# Patient Record
Sex: Female | Born: 2000 | Race: Black or African American | Hispanic: No | Marital: Single | Smoking: Never smoker
Health system: Southern US, Community
[De-identification: ages and names within clinical notes are randomized; demographics above are authoritative.]

## PROBLEM LIST (undated history)

## (undated) ENCOUNTER — Ambulatory Visit

## (undated) ENCOUNTER — Inpatient Hospital Stay (HOSPITAL_COMMUNITY): Payer: Self-pay

## (undated) DIAGNOSIS — F32A Depression, unspecified: Secondary | ICD-10-CM

## (undated) DIAGNOSIS — Z973 Presence of spectacles and contact lenses: Secondary | ICD-10-CM

## (undated) DIAGNOSIS — F419 Anxiety disorder, unspecified: Secondary | ICD-10-CM

## (undated) DIAGNOSIS — F329 Major depressive disorder, single episode, unspecified: Secondary | ICD-10-CM

## (undated) DIAGNOSIS — D571 Sickle-cell disease without crisis: Secondary | ICD-10-CM

## (undated) HISTORY — DX: Anxiety disorder, unspecified: F41.9

## (undated) HISTORY — DX: Sickle-cell disease without crisis: D57.1

## (undated) HISTORY — DX: Depression, unspecified: F32.A

## (undated) HISTORY — DX: Major depressive disorder, single episode, unspecified: F32.9

## (undated) HISTORY — PX: TONSILLECTOMY: SUR1361

---
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
## 2019-01-22 ENCOUNTER — Encounter (HOSPITAL_COMMUNITY): Payer: Self-pay

## 2019-01-22 ENCOUNTER — Other Ambulatory Visit: Payer: Self-pay

## 2019-01-22 ENCOUNTER — Emergency Department (HOSPITAL_COMMUNITY)
Admission: EM | Admit: 2019-01-22 | Discharge: 2019-01-22 | Disposition: A | Discharge status: Home / Self Care | Payer: No Typology Code available for payment source | Attending: Emergency Medicine | Admitting: Emergency Medicine

## 2019-01-22 ENCOUNTER — Emergency Department (HOSPITAL_COMMUNITY): Payer: No Typology Code available for payment source

## 2019-01-22 DIAGNOSIS — Y92410 Unspecified street and highway as the place of occurrence of the external cause: Secondary | ICD-10-CM | POA: Insufficient documentation

## 2019-01-22 DIAGNOSIS — Z975 Presence of (intrauterine) contraceptive device: Secondary | ICD-10-CM | POA: Diagnosis not present

## 2019-01-22 DIAGNOSIS — Y93I9 Activity, other involving external motion: Secondary | ICD-10-CM | POA: Diagnosis not present

## 2019-01-22 DIAGNOSIS — Y999 Unspecified external cause status: Secondary | ICD-10-CM | POA: Diagnosis not present

## 2019-01-22 DIAGNOSIS — S29011A Strain of muscle and tendon of front wall of thorax, initial encounter: Secondary | ICD-10-CM | POA: Insufficient documentation

## 2019-01-22 DIAGNOSIS — S299XXA Unspecified injury of thorax, initial encounter: Secondary | ICD-10-CM | POA: Diagnosis present

## 2019-01-22 MED ORDER — IBUPROFEN 600 MG PO TABS: 600.0000 mg | ORAL_TABLET | Freq: Four times a day (QID) | ORAL | 0 refills | Status: DC | PRN

## 2019-01-22 MED ORDER — IBUPROFEN 200 MG PO TABS
600.0000 mg | ORAL_TABLET | Freq: Once | ORAL | Status: AC
  Administered 2019-01-22: 600 mg via ORAL
  Filled 2019-01-22: qty 3
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
## 2019-01-22 NOTE — ED Notes (Signed)
Officer at bedside.

## 2019-01-22 NOTE — ED Notes (Signed)
Pt verbalizes understanding of DC instructions. Pt belongings returned and is ambulatory out of ED. Pt used desk phone to call transportation

## 2019-01-22 NOTE — ED Notes (Signed)
Pt using desk phone to call mother and update on condition
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
## 2019-04-11 ENCOUNTER — Ambulatory Visit (HOSPITAL_COMMUNITY)
Admission: EM | Admit: 2019-04-11 | Discharge: 2019-04-11 | Disposition: A | Discharge status: Home / Self Care | Payer: No Typology Code available for payment source | Attending: Family Medicine | Admitting: Family Medicine

## 2019-04-11 ENCOUNTER — Encounter (HOSPITAL_COMMUNITY): Payer: Self-pay | Admitting: Emergency Medicine

## 2019-04-11 ENCOUNTER — Other Ambulatory Visit: Payer: Self-pay

## 2019-04-11 ENCOUNTER — Ambulatory Visit (INDEPENDENT_AMBULATORY_CARE_PROVIDER_SITE_OTHER): Payer: No Typology Code available for payment source

## 2019-04-11 DIAGNOSIS — M25561 Pain in right knee: Secondary | ICD-10-CM

## 2019-04-11 DIAGNOSIS — S01412A Laceration without foreign body of left cheek and temporomandibular area, initial encounter: Secondary | ICD-10-CM | POA: Diagnosis not present

## 2019-04-11 MED ORDER — IBUPROFEN 800 MG PO TABS: 800.0000 mg | ORAL_TABLET | Freq: Three times a day (TID) | ORAL | 0 refills | Status: DC

## 2019-04-11 MED ORDER — IBUPROFEN 800 MG PO TABS
800.0000 mg | ORAL_TABLET | Freq: Once | ORAL | Status: AC
  Administered 2019-04-11: 800 mg via ORAL

## 2019-04-11 MED ORDER — IBUPROFEN 800 MG PO TABS
ORAL_TABLET | ORAL | Status: AC
  Filled 2019-04-11: qty 1
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
## 2019-04-11 NOTE — Discharge Instructions (Signed)
Use ice to knee.  Take ibuprofen 3 times a day with food.  Limit walking while knee is painful. Wash face of wound twice a day.  Apply antibiotic ointment, Aquaphor, or Vaseline several times a day to keep it moist Return as needed May need orthopedic if knee pain persists
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
## 2019-04-16 ENCOUNTER — Emergency Department (HOSPITAL_COMMUNITY)
Admission: EM | Admit: 2019-04-16 | Discharge: 2019-04-16 | Disposition: A | Discharge status: Home / Self Care | Payer: No Typology Code available for payment source | Attending: Emergency Medicine | Admitting: Emergency Medicine

## 2019-04-16 ENCOUNTER — Other Ambulatory Visit: Payer: Self-pay

## 2019-04-16 ENCOUNTER — Encounter (HOSPITAL_COMMUNITY): Payer: Self-pay | Admitting: Emergency Medicine

## 2019-04-16 DIAGNOSIS — T424X2A Poisoning by benzodiazepines, intentional self-harm, initial encounter: Secondary | ICD-10-CM | POA: Insufficient documentation

## 2019-04-16 DIAGNOSIS — R45851 Suicidal ideations: Secondary | ICD-10-CM | POA: Insufficient documentation

## 2019-04-16 DIAGNOSIS — F1210 Cannabis abuse, uncomplicated: Secondary | ICD-10-CM | POA: Insufficient documentation

## 2019-04-16 DIAGNOSIS — Z20822 Contact with and (suspected) exposure to covid-19: Secondary | ICD-10-CM | POA: Diagnosis not present

## 2019-04-16 DIAGNOSIS — T50902A Poisoning by unspecified drugs, medicaments and biological substances, intentional self-harm, initial encounter: Secondary | ICD-10-CM

## 2019-04-16 LAB — CBC
HCT: 43.3 % (ref 36.0–46.0)
Hemoglobin: 14.7 g/dL (ref 12.0–15.0)
MCH: 30.6 pg (ref 26.0–34.0)
MCHC: 33.9 g/dL (ref 30.0–36.0)
MCV: 90 fL (ref 80.0–100.0)
Platelets: 301 10*3/uL (ref 150–400)
RBC: 4.81 MIL/uL (ref 3.87–5.11)
RDW: 12.2 % (ref 11.5–15.5)
WBC: 6.5 10*3/uL (ref 4.0–10.5)
nRBC: 0 % (ref 0.0–0.2)

## 2019-04-16 LAB — COMPREHENSIVE METABOLIC PANEL
ALT: 12 U/L (ref 0–44)
AST: 15 U/L (ref 15–41)
Albumin: 3.8 g/dL (ref 3.5–5.0)
Alkaline Phosphatase: 62 U/L (ref 38–126)
Anion gap: 11 (ref 5–15)
BUN: 10 mg/dL (ref 6–20)
CO2: 21 mmol/L — ABNORMAL LOW (ref 22–32)
Calcium: 9.3 mg/dL (ref 8.9–10.3)
Chloride: 108 mmol/L (ref 98–111)
Creatinine, Ser: 0.82 mg/dL (ref 0.44–1.00)
GFR calc Af Amer: >60 mL/min (ref >60)
GFR calc non Af Amer: >60 mL/min (ref >60)
Glucose, Bld: 87 mg/dL (ref 70–99)
Potassium: 3.8 mmol/L (ref 3.5–5.1)
Sodium: 140 mmol/L (ref 135–145)
Total Bilirubin: 0.9 mg/dL (ref 0.3–1.2)
Total Protein: 6.5 g/dL (ref 6.5–8.1)

## 2019-04-16 LAB — RAPID URINE DRUG SCREEN, HOSP PERFORMED
Amphetamines: NOT DETECTED
Barbiturates: NOT DETECTED
Benzodiazepines: POSITIVE — AB
Cocaine: NOT DETECTED
Opiates: NOT DETECTED
Tetrahydrocannabinol: POSITIVE — AB

## 2019-04-16 LAB — ACETAMINOPHEN LEVEL: Acetaminophen (Tylenol), Serum: <10 ug/mL — ABNORMAL LOW (ref 10–30)

## 2019-04-16 LAB — RESPIRATORY PANEL BY RT PCR (FLU A&B, COVID)
Influenza A by PCR: NEGATIVE
Influenza B by PCR: NEGATIVE
SARS Coronavirus 2 by RT PCR: NEGATIVE

## 2019-04-16 LAB — I-STAT BETA HCG BLOOD, ED (MC, WL, AP ONLY): I-stat hCG, quantitative: <5 m[IU]/mL (ref <5)

## 2019-04-16 LAB — CBG MONITORING, ED: Glucose-Capillary: 88 mg/dL (ref 70–99)

## 2019-04-16 LAB — SALICYLATE LEVEL: Salicylate Lvl: <7 mg/dL — ABNORMAL LOW (ref 7.0–30.0)

## 2019-04-16 LAB — ETHANOL: Alcohol, Ethyl (B): <10 mg/dL (ref <10)

## 2019-04-16 NOTE — Progress Notes (Signed)
Pt accepted to [facility]. Cyril Mourning is the accepting provider.  Call report to [phone] Kriste Basque, RN @ [facility] ED notified.   Pt is Voluntary.  Pt may be transported by [employer] Pt scheduled to arrive as soon as transport has been set up    Ruthann Cancer MSW, [employer] Social Worker Disposition  [facility] Ph: [phone] Fax: [phone]  04/16/2019 11:10 AM
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
## 2019-04-16 NOTE — ED Notes (Signed)
Kimberly Roach, sister, [phone] would like an update when available   Breakfast ordered
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
## 2019-04-16 NOTE — ED Notes (Signed)
Pt's mother called - woke pt - pt requested for RN to ask her mother to call back later as she does not want to get up from bed as of yet. Mother aware.
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
## 2019-04-16 NOTE — ED Provider Notes (Signed)
[facility] EMERGENCY DEPARTMENT Provider Note   CSN: [id number] Arrival date & time: 04/16/19  0021     History Chief Complaint  Patient presents with  . Suicide Attempt    Kimberly Roach is a 19 y.o. female.  Patient to ED by EMS called by mother who found her unconscious on the floor. The patient reportedly took overdoses of ibuprofen and Xanax. Unknown suicide history. She is somnolent and wakes easily but does not provide details of why she is here. She does endorse overdose motivated by suicidal thoughts.  The history is provided by the patient. No language interpreter was used.       Past Medical History:  Diagnosis Date  . Anxiety   . Depression   . Wears contact lenses     Patient Active Problem List   Diagnosis Date Noted  . Marijuana abuse 12/31/2017  . 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 MDD (major depressive disorder), recurrent episode, moderate (HCC) 12/08/2017  . Other constipation 03/28/2016  . Vaginal discharge 03/28/2016  . Bacterial vaginosis 07/23/2014  . Recurrent UTI 07/23/2014    Past Surgical History:  Procedure Laterality Date  . TONSILLECTOMY    . TONSILLECTOMY AND ADENOIDECTOMY N/A 11/22/2014   Procedure: TONSILLECTOMY AND ADENOIDECTOMY;  Surgeon: Bud Face, MD;  Location: [facility];  Service: ENT;  Laterality: N/A;  ADENOIDS CAUTERIZED NO TISSUE SENT FOR PATHOLOGY     OB History    Gravida  1   Para      Term      Preterm      AB  1   Living        SAB  1   TAB      Ectopic      Multiple      Live Births              Family History  Problem Relation Age of Onset  . Anxiety disorder Father   . Depression Father     Social History   Tobacco Use  . Smoking status: Never Smoker  . Smokeless tobacco: Never Used  Substance Use Topics  . Alcohol use: Yes    Alcohol/week: 1.0 standard drinks    Types: 1 Glasses of wine per week  . Drug use: Not Currently   Types: Marijuana    Comment: maybe used once a week maybe    Home Medications Prior to Admission medications   Medication Sig Start Date End Date Taking? Authorizing Provider  ibuprofen (ADVIL) 600 MG tablet Take 1 tablet (600 mg total) by mouth every 6 (six) hours as needed. 01/22/19   Tilden Fossa, MD  ibuprofen (ADVIL) 800 MG tablet Take 1 tablet (800 mg total) by mouth 3 (three) times daily. 04/11/19   Eustace Moore, MD  levonorgestrel [facility]) 20 MCG/24HR IUD by Intrauterine route.    [provider]  albuterol (PROVENTIL) (2.5 MG/3ML) 0.083% nebulizer solution Take 3 mLs (2.5 mg total) by nebulization every 6 (six) hours as needed for wheezing or shortness of breath. 03/12/18 11/06/18  Linus Mako B, NP  ipratropium (ATROVENT) 0.06 % nasal spray Place 2 sprays into both nostrils 3 (three) times daily as needed for rhinitis. 11/06/18 11/25/18  Georgetta Haber, NP    Allergies    Apple  Review of Systems   Review of Systems  Reason unable to perform ROS: the patient does not fully participate in questioning.    Physical Exam Updated Vital Signs BP  101/72   Pulse 73   Temp 98.2 F (36.8 C) (Oral)   Resp 16   SpO2 98%   Physical Exam Vitals and nursing note reviewed.  Constitutional:      Appearance: She is well-developed.     Comments: Sleepy but wakes with minimal stimuli.  HENT:     Head: Normocephalic.  Cardiovascular:     Rate and Rhythm: Normal rate and regular rhythm.  Pulmonary:     Effort: Pulmonary effort is normal.     Breath sounds: Normal breath sounds.  Abdominal:     General: Bowel sounds are normal.     Palpations: Abdomen is soft.     Tenderness: There is no abdominal tenderness. There is no guarding or rebound.  Musculoskeletal:        General: Normal range of motion.     Cervical back: Normal range of motion and neck supple.  Skin:    General: Skin is warm and dry.     Findings: No rash.  Neurological:     Mental Status: She  is alert.  Psychiatric:        Thought Content: Thought content includes suicidal ideation.     Comments: Endorses SI, intentional overdose.     ED Results / Procedures / Treatments   Labs (all labs ordered are listed, but only abnormal results are displayed) Labs Reviewed  COMPREHENSIVE METABOLIC PANEL - Abnormal; Notable for the following components:      Result Value   CO2 21 (*)    All other components within normal limits  SALICYLATE LEVEL - Abnormal; Notable for the following components:   Salicylate Lvl <5.0 (*)    All other components within normal limits  ACETAMINOPHEN LEVEL - Abnormal; Notable for the following components:   Acetaminophen (Tylenol), Serum <10 (*)    All other components within normal limits  ETHANOL  CBC  RAPID URINE DRUG SCREEN, HOSP PERFORMED  CBG MONITORING, ED  I-STAT BETA HCG BLOOD, ED ([facility], [facility], [facility] ONLY)    EKG None  Radiology No results found.  Procedures Procedures (including critical care time)  Medications Ordered in ED Medications - No data to display  ED Course  I have reviewed the triage vital signs and the nursing notes.  Pertinent labs & imaging results that were available during my care of the patient were reviewed by me and considered in my medical decision making (see chart for details).    MDM Rules/Calculators/A&P                      Patient to ED after being found unconscious by mother, admitting here to suicide attempt by overdose. Reported to have taken 800 mg ibuprofen x 4, 1 mg Xanax x 2.   VSS. She wakes easily and is able to answer questions. Labs are unremarkable with UDS pending urine collection. She is considered medically cleared for TTS evaluation.   Final Clinical Impression(s) / ED Diagnoses Final diagnoses:  None   1. Overdose 2. Suicidal ideation  Rx / DC Orders ED Discharge Orders    None       Charlann Lange, PA-C 04/16/19 [id number]    Veryl Speak, MD 04/16/19 2259

## 2019-04-16 NOTE — ED Notes (Signed)
TTS being performed.  

## 2019-04-16 NOTE — ED Notes (Signed)
Pt found to be on the floor in the bathroom. States she fell and hit her head. Pt said she got dizzy. Sitter was outside the bathroom door and did not hear the pt fall. Pt aox4, able to ambulate, denies pain, no obvious trauma noted by RN. VSS. Will continue to monitor.

## 2019-04-16 NOTE — ED Notes (Signed)
[facility] [employer] called and advised of delay w/transport - may be a few hours.

## 2019-04-16 NOTE — ED Notes (Signed)
[facility] [employer] called back and advised 2nd driver will be calling to set up transport time.

## 2019-04-16 NOTE — ED Notes (Signed)
Pt's sister brought pt's eyeglasses and underwear in yellow bag - Eyeglasses given to pt - other placed pt's belongings bag.

## 2019-04-16 NOTE — ED Notes (Signed)
Jerry Caras, sister, [phone] would like an update when available

## 2019-04-16 NOTE — ED Notes (Signed)
Pt spoke w/mother on phone at nurses' desk. RN then spoke w/pt's mother as requested by pt - pt requested to notify mother of tx plan and "any info". Mother aware of tx plan- Inpt. Pt's mother to contact pt's sister to ask her to bring her pt's eyeglasses d/t RN unable to locate pt's glasses w/her belongings nor at bedside.

## 2019-04-16 NOTE — ED Notes (Signed)
The pt denies smoking drinking and denies cocaine heroin  use

## 2019-04-16 NOTE — ED Notes (Signed)
Patients belongings in lock 6, pt wanded by security

## 2019-04-16 NOTE — ED Notes (Signed)
Patient verbalizes understanding of discharge instructions. Opportunity for questioning and answers were provided. Armband removed by staff, pt discharged from ED w/ safe transport.

## 2019-04-16 NOTE — Progress Notes (Signed)
Patient meets criteria for inpatient treatment per Hillery Jacks, NP. No appropriate beds at [facility] currently. CSW faxed referrals to the following facilities for review:  [facility]   [facility]  [facility]  [facility]  [facility]  [facility]  [facility]  [facility]   [facility]  [facility]   [facility]  [facility]   TTS will continue to seek bed placement.     Ruthann Cancer MSW, [facility] Social Worker Disposition  [facility] Ph: [phone] Fax: [phone] 04/16/2019 10:42 AM

## 2019-04-16 NOTE — ED Notes (Signed)
Pt aware of tx plan - Accepted to [facility] - Pt voiced agreement and understanding of tx plan - States she will go voluntarily. Pt's mother advised she will pick up pt from [facility] when d/c'd.
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
## 2019-05-25 ENCOUNTER — Other Ambulatory Visit: Payer: Self-pay

## 2019-05-25 ENCOUNTER — Ambulatory Visit (INDEPENDENT_AMBULATORY_CARE_PROVIDER_SITE_OTHER): Payer: Medicaid Other | Admitting: Obstetrics and Gynecology

## 2019-05-25 ENCOUNTER — Other Ambulatory Visit (HOSPITAL_COMMUNITY)
Admission: RE | Admit: 2019-05-25 | Discharge: 2019-05-25 | Disposition: A | Discharge status: Home / Self Care | Payer: Medicaid Other | Source: Ambulatory Visit | Attending: Obstetrics and Gynecology | Admitting: Obstetrics and Gynecology

## 2019-05-25 ENCOUNTER — Encounter: Payer: Self-pay | Admitting: Obstetrics and Gynecology

## 2019-05-25 VITALS — BP 100/70 | Ht 60.0 in | Wt 123.0 lb

## 2019-05-25 DIAGNOSIS — Z113 Encounter for screening for infections with a predominantly sexual mode of transmission: Secondary | ICD-10-CM | POA: Insufficient documentation

## 2019-05-25 DIAGNOSIS — B9689 Other specified bacterial agents as the cause of diseases classified elsewhere: Secondary | ICD-10-CM

## 2019-05-25 DIAGNOSIS — Z30432 Encounter for removal of intrauterine contraceptive device: Secondary | ICD-10-CM | POA: Diagnosis not present

## 2019-05-25 DIAGNOSIS — N760 Acute vaginitis: Secondary | ICD-10-CM | POA: Diagnosis not present

## 2019-05-25 DIAGNOSIS — Z01419 Encounter for gynecological examination (general) (routine) without abnormal findings: Secondary | ICD-10-CM

## 2019-05-25 LAB — POCT WET PREP WITH KOH
Clue Cells Wet Prep HPF POC: POSITIVE
KOH Prep POC: POSITIVE — AB
Trichomonas, UA: NEGATIVE
Yeast Wet Prep HPF POC: NEGATIVE

## 2019-05-25 MED ORDER — METRONIDAZOLE 500 MG PO TABS: 500.0000 mg | ORAL_TABLET | Freq: Two times a day (BID) | ORAL | 0 refills | Status: AC

## 2019-05-25 NOTE — Progress Notes (Signed)
PCP:  Mickie Bail, MD   Chief Complaint  Patient presents with  . Gynecologic Exam  . Vaginal Discharge    irritation, no odor or itchiness x 3 days    HPI:      [name] is a 18 y.o. G1P0010 who LMP was No LMP recorded. (Menstrual status: IUD)., presents today for her annual examination.  Her menses are absent with IUD. Dysmenorrhea none. She does not have intermenstrual bleeding.  Sex activity: single partner, contraception - IUD. Mirena placed 11/19. Doesn't like it. "Feels gross". Has nausea, dyspareunia. Would like it removed. Declines other BC for now, will just use condoms.  Last Pap: N/A due to age Hx of STDs: none  Hx of BV in past, taking probiotics. Has had increased d/c with irritation, no odor for a few days. No meds to treat. No prior abx use, no new soaps/detergents.  There is no FH of breast cancer. There is no FH of ovarian cancer. The patient does not do self-breast exams.  Tobacco use: The patient denies current or previous tobacco use. Alcohol use: social drinker No drug use.  Exercise: moderately active  She does get adequate calcium but not Vitamin D in her diet.   Past Medical History:  Diagnosis Date  . Anxiety   . Depression   . Wears contact lenses     Past Surgical History:  Procedure Laterality Date  . TONSILLECTOMY    . TONSILLECTOMY AND ADENOIDECTOMY N/A 11/22/2014   Procedure: TONSILLECTOMY AND ADENOIDECTOMY;  Surgeon: Bud Face, MD;  Location: [facility];  Service: ENT;  Laterality: N/A;  ADENOIDS CAUTERIZED NO TISSUE SENT FOR PATHOLOGY    Family History  Problem Relation Age of Onset  . Anxiety disorder Father   . Depression Father     Social History   Socioeconomic History  . Marital status: Single    Spouse name: Not on file  . Number of children: 0  . Years of education: Not on file  . Highest education level: 12th grade  Occupational History    Comment: full time student  Tobacco Use  .  Smoking status: Never Smoker  . Smokeless tobacco: Never Used  Substance and Sexual Activity  . Alcohol use: Not Currently    Alcohol/week: 1.0 standard drinks    Types: 1 Glasses of wine per week  . Drug use: Not Currently    Types: Marijuana    Comment: maybe used once a week maybe  . Sexual activity: Yes    Birth control/protection: I.U.D.    Comment: Mirena  Other Topics Concern  . Not on file  Social History Narrative   Ex boyfriend   Social Determinants of Health   Financial Resource Strain:   . Difficulty of Paying Living Expenses:   Food Insecurity:   . Worried About Programme researcher, broadcasting/film/video in the Last Year:   . Barista in the Last Year:   Transportation Needs:   . Freight forwarder (Medical):   Marland Kitchen Lack of Transportation (Non-Medical):   Physical Activity:   . Days of Exercise per Week:   . Minutes of Exercise per Session:   Stress:   . Feeling of Stress :   Social Connections:   . Frequency of Communication with Friends and Family:   . Frequency of Social Gatherings with Friends and Family:   . Attends Religious Services:   . Active Member of Clubs or Organizations:   . Attends Club or  Organization Meetings:   Marland Kitchen Marital Status:   Intimate Partner Violence:   . Fear of Current or Ex-Partner:   . Emotionally Abused:   Marland Kitchen Physically Abused:   . Sexually Abused:      Current Outpatient Medications:  .  levonorgestrel (MIRENA) 20 MCG/24HR IUD, by Intrauterine route., Disp: , Rfl:  .  metroNIDAZOLE (FLAGYL) 500 MG tablet, Take 1 tablet (500 mg total) by mouth 2 (two) times daily for 7 days., Disp: 14 tablet, Rfl: 0    ROS:  Review of Systems  Constitutional: Positive for fatigue. Negative for fever and unexpected weight change.  Respiratory: Negative for cough, shortness of breath and wheezing.   Cardiovascular: Negative for chest pain, palpitations and leg swelling.  Gastrointestinal: Positive for nausea. Negative for blood in stool, constipation,  diarrhea and vomiting.  Endocrine: Negative for cold intolerance, heat intolerance and polyuria.  Genitourinary: Positive for dyspareunia and vaginal discharge. Negative for dysuria, flank pain, frequency, genital sores, hematuria, menstrual problem, pelvic pain, urgency, vaginal bleeding and vaginal pain.  Musculoskeletal: Negative for back pain, joint swelling and myalgias.  Skin: Negative for rash.  Neurological: Negative for dizziness, syncope, light-headedness, numbness and headaches.  Hematological: Negative for adenopathy.  Psychiatric/Behavioral: Negative for agitation, confusion, sleep disturbance and suicidal ideas. The patient is not nervous/anxious.   BREAST: No symptoms   Objective: BP 100/70   Ht 5' (1.524 m)   Wt 123 lb (55.8 kg)   BMI 24.02 kg/m    Physical Exam Constitutional:      Appearance: She is well-developed.  Genitourinary:     Vulva, cervix, uterus, right adnexa and left adnexa normal.     No vulval lesion or tenderness noted.     Vaginal discharge present.     No vaginal erythema or tenderness.     No cervical polyp.     IUD strings visualized.     Uterus is not enlarged or tender.     No right or left adnexal mass present.     Right adnexa not tender.     Left adnexa not tender.  Neck:     Thyroid: No thyromegaly.  Cardiovascular:     Rate and Rhythm: Normal rate and regular rhythm.     Heart sounds: Normal heart sounds. No murmur.  Pulmonary:     Effort: Pulmonary effort is normal.     Breath sounds: Normal breath sounds.  Chest:     Breasts:        Right: No mass, nipple discharge, skin change or tenderness.        Left: No mass, nipple discharge, skin change or tenderness.  Abdominal:     Palpations: Abdomen is soft.     Tenderness: There is no abdominal tenderness. There is no guarding.  Musculoskeletal:        General: Normal range of motion.     Cervical back: Normal range of motion.  Neurological:     General: No focal deficit  present.     Mental Status: She is alert and oriented to person, place, and time.     Cranial Nerves: No cranial nerve deficit.  Skin:    General: Skin is warm and dry.  Psychiatric:        Mood and Affect: Mood normal.        Behavior: Behavior normal.        Thought Content: Thought content normal.        Judgment: Judgment normal.  Vitals reviewed.  Results: Results for orders placed or performed in visit on 05/25/19 (from the past 24 hour(s))  POCT Wet Prep with KOH     Status: Abnormal   Collection Time: 05/25/19  4:30 PM  Result Value Ref Range   Trichomonas, UA Negative    Clue Cells Wet Prep HPF POC pos    Epithelial Wet Prep HPF POC     Yeast Wet Prep HPF POC neg    Bacteria Wet Prep HPF POC     RBC Wet Prep HPF POC     WBC Wet Prep HPF POC     KOH Prep POC Positive (A) Negative    IUD Removal Strings of IUD identified and grasped.  IUD removed without problem with ring forceps.  Pt tolerated this well.  IUD noted to be intact.  Assessment/Plan: Encounter for annual routine gynecological examination  Screening for STD (sexually transmitted disease) - Plan: Cervicovaginal ancillary only  BV (bacterial vaginosis) - Plan: POCT Wet Prep with KOH, metroNIDAZOLE (FLAGYL) 500 MG tablet; Pos sx/wet prep. Rx flagyl, no EtOH. Will RF if sx recur. Cont probiotics, add condoms.   Encounter for IUD removal--pt tolerated well. Declines other BC. Condoms for now. F/u prn.   Meds ordered this encounter  Medications  . metroNIDAZOLE (FLAGYL) 500 MG tablet    Sig: Take 1 tablet (500 mg total) by mouth 2 (two) times daily for 7 days.    Dispense:  14 tablet    Refill:  0    Order Specific Question:   Supervising Provider    Answer:   Nadara Mustard [[id number]]             GYN counsel STD prevention, family planning choices, adequate intake of calcium and vitamin D, diet and exercise     F/U  Return in about 1 year (around 05/24/2020).  Kathelene Rumberger B. Cornelious Bartolucci,  PA-C 05/25/2019 4:36 PM

## 2019-05-25 NOTE — Patient Instructions (Signed)
I value your feedback and entrusting us with your care. If you get a [facility] patient survey, I would appreciate you taking the time to let us know about your experience today. Thank you!  As of December 30, 2018, your lab results will be released to your MyChart immediately, before I even have a chance to see them. Please give me time to review them and contact you if there are any abnormalities. Thank you for your patience.  

## 2019-05-27 LAB — CERVICOVAGINAL ANCILLARY ONLY
Chlamydia: NEGATIVE
Comment: NEGATIVE
Comment: NORMAL
Neisseria Gonorrhea: NEGATIVE

## 2019-08-15 ENCOUNTER — Ambulatory Visit (HOSPITAL_COMMUNITY): Admit: 2019-08-15 | Discharge status: Against Medical Advice | Payer: BC Managed Care – PPO

## 2019-08-15 IMAGING — US US PELVIS COMPLETE
1 series · 13 of 25 positions shown · non-contrast
Comparison: None.

CLINICAL DATA: Left adnexal tenderness and vomiting

EXAM:
TRANSABDOMINAL ULTRASOUND OF PELVIS
DOPPLER ULTRASOUND OF OVARIES
TECHNIQUE: Study was performed transabdominally to optimize pelvic field of
view evaluation and transvaginally to optimize internal visceral
architecture evaluation.
Color and duplex Doppler ultrasound was utilized to evaluate blood
flow to the ovaries.

[Series 1: us pelvis complete · 13 of 61 slices shown]
[im 1/61]
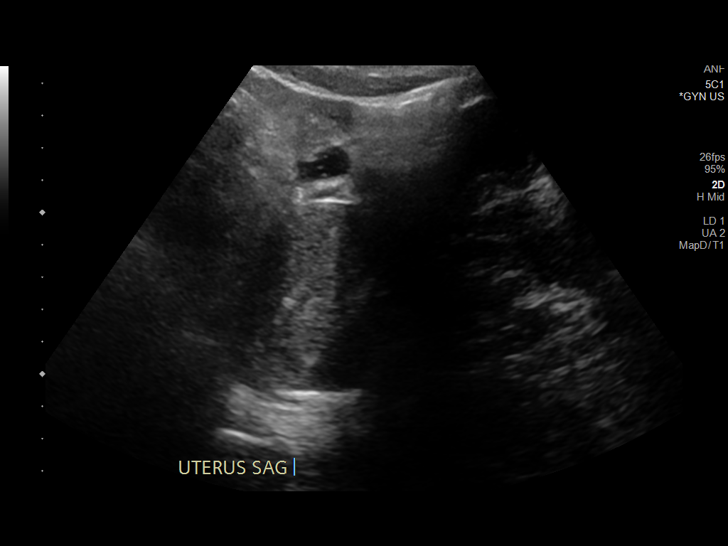
[im 6/61]
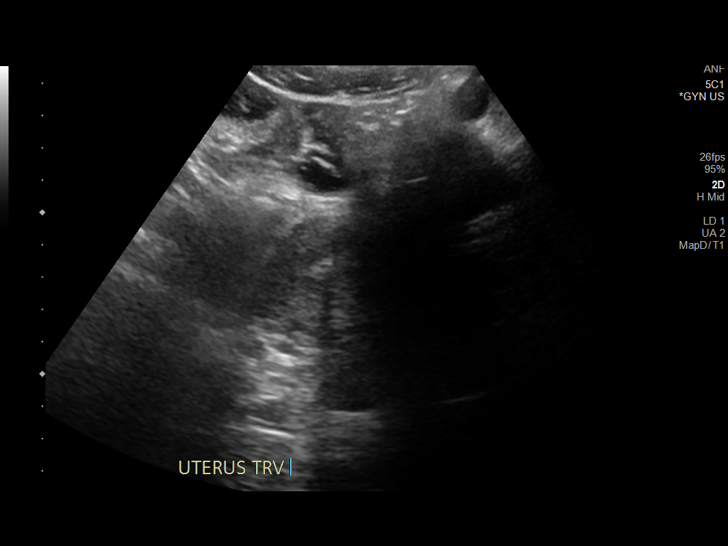
[im 11/61]
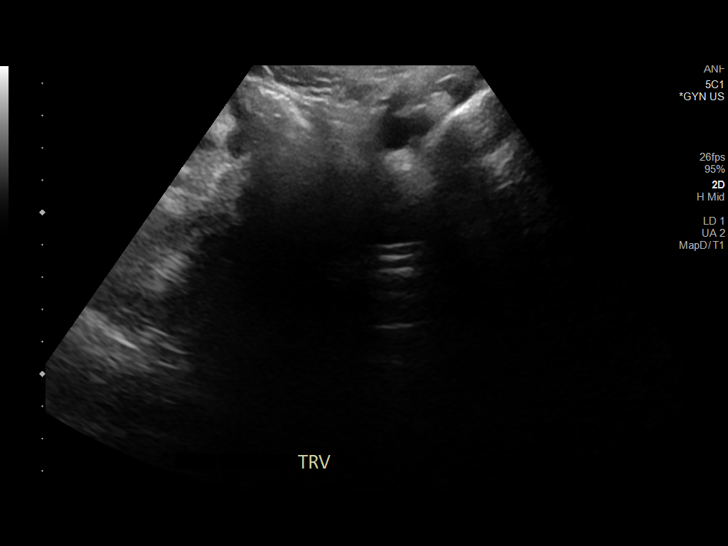
[im 16/61]
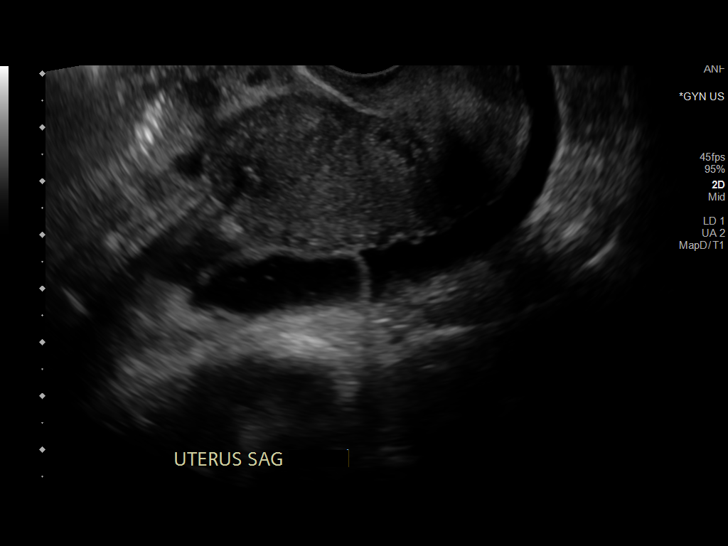
[im 21/61]
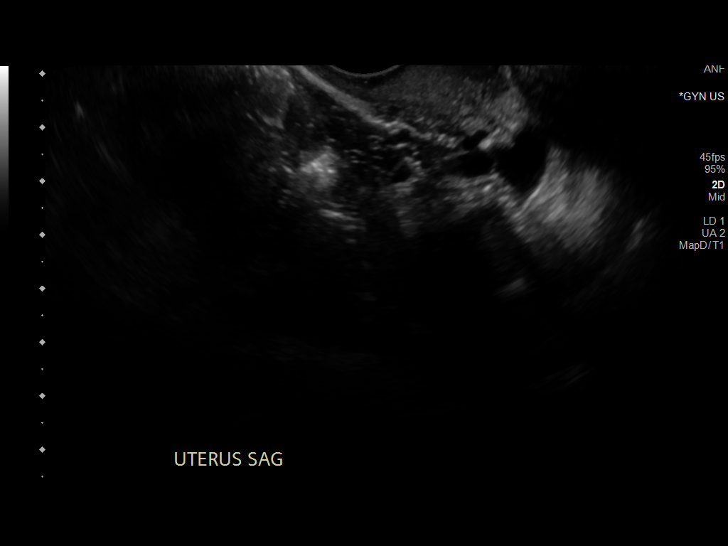
[im 26/61]
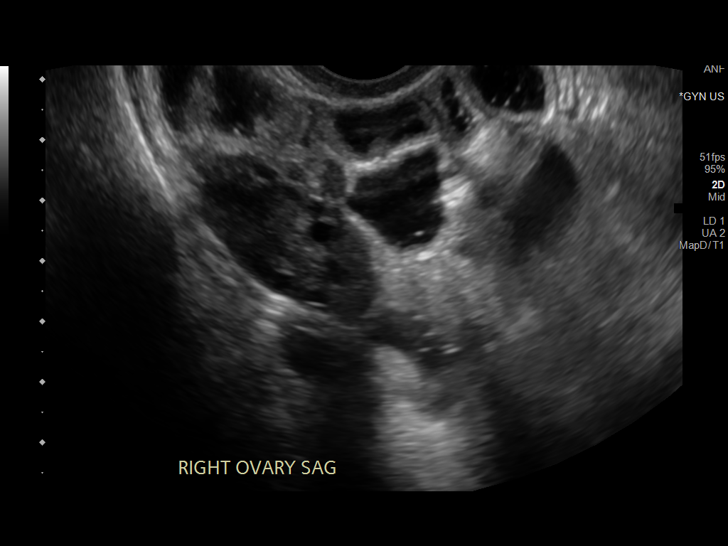
[im 31/61]
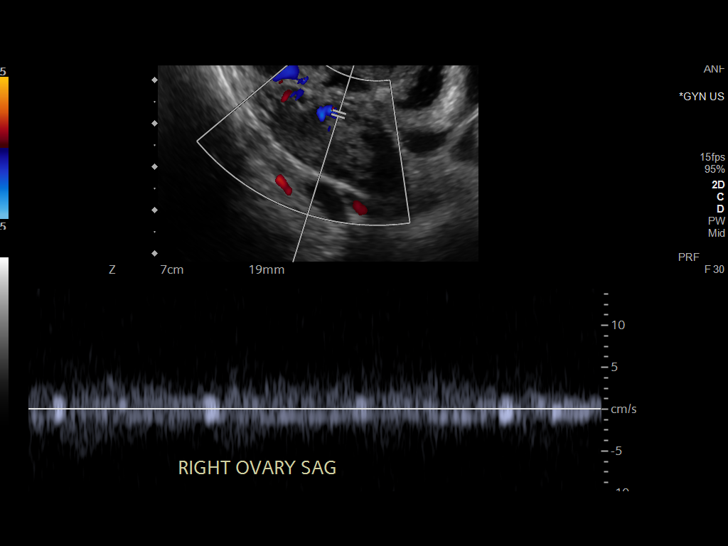
[im 36/61]
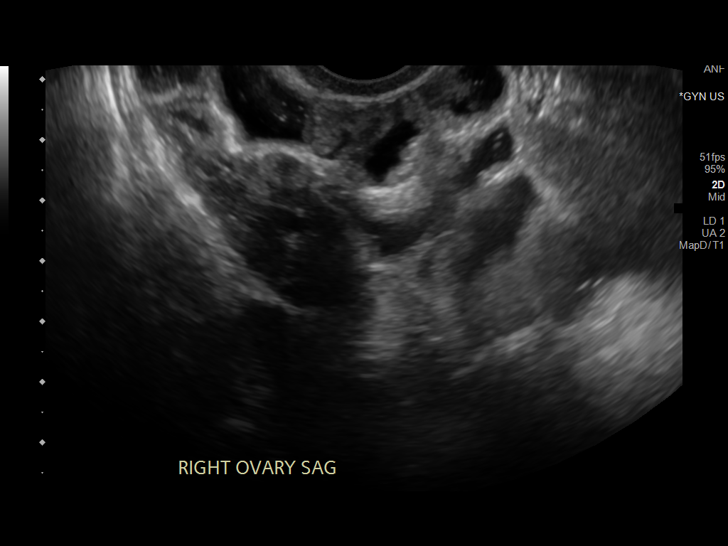
[im 41/61]
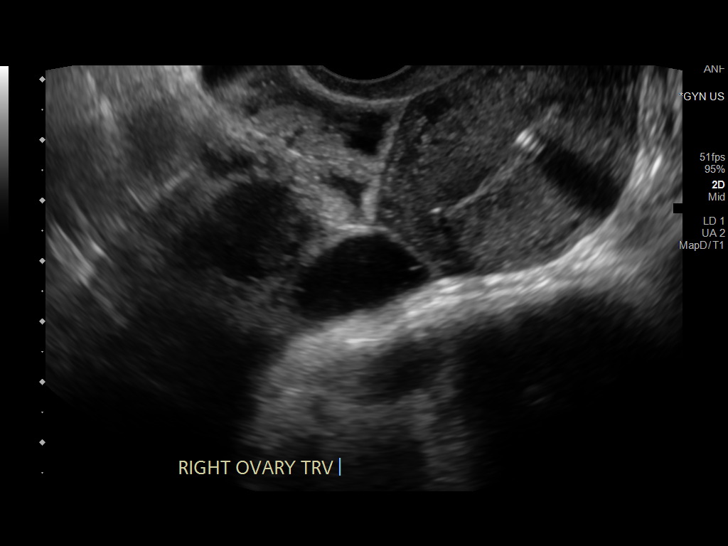
[im 46/61]
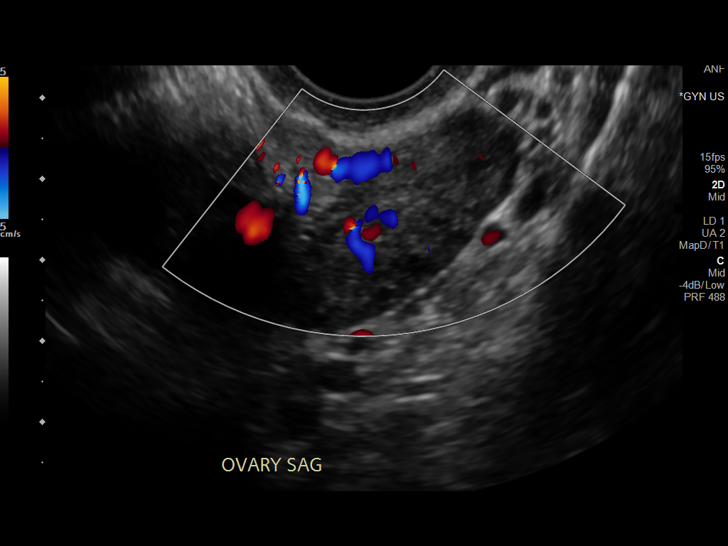
[im 51/61]
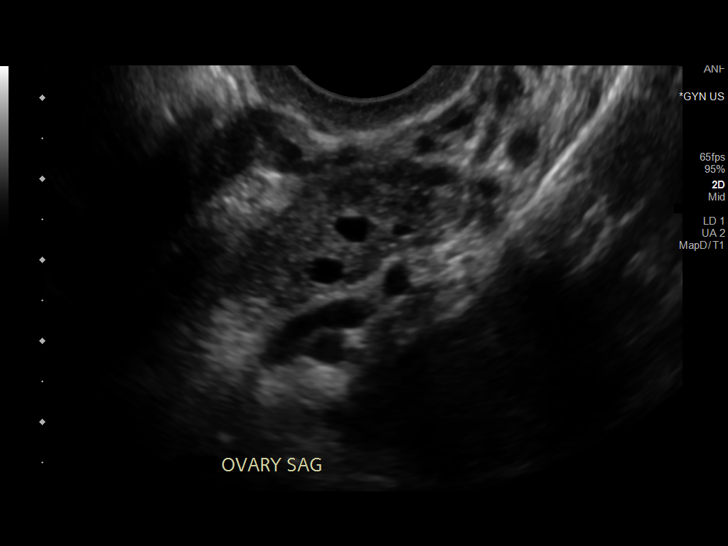
[im 56/61]
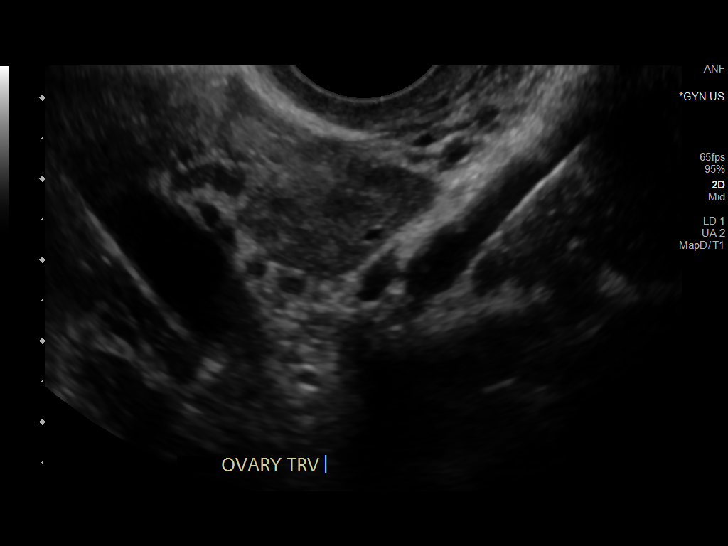
[im 61/61]
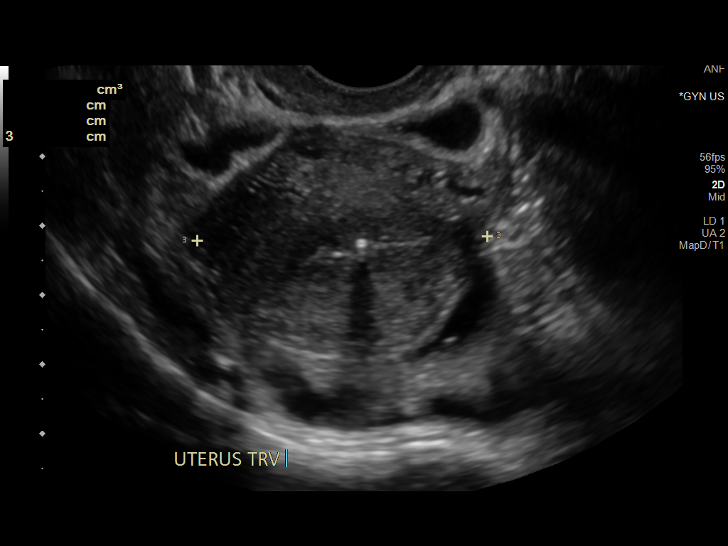

[13 of 25 positions shown; findings below may reference images not displayed]

FINDINGS: Uterus

Measurements: 6.2 x 3.1 x 4.2 cm = volume: 42.4 mL. No fibroids or
other mass visualized.

Endometrium

Thickness: 4 mm. No focal abnormality visualized. Intrauterine
device is positioned within the endometrium.

Right ovary

Measurements: 3.8 x 1.7 x 2.9 cm = volume: 9.5 mL. Normal
appearance/no adnexal mass.

Left ovary

Measurements: 3.5 x 1.5 x 2.6 cm = volume: 7.5 mL. Normal
appearance/no adnexal mass.

Pulsed Doppler evaluation demonstrates normal low-resistance
arterial and venous waveforms in both ovaries.

Other: There is a small amount of free fluid in the cul-de-sac.
IMPRESSION: 1. Small amount of free fluid in the dependent portion of the
pelvis. Question recent ovarian cyst rupture.

2. No extrauterine pelvic mass. No demonstrable ovarian torsion on
either side. Vascular flow noted in each ovary with low resistance
waveforms bilaterally.

3. No intrauterine mass. Intrauterine device positioned within the
endometrium.

## 2019-08-22 ENCOUNTER — Other Ambulatory Visit: Payer: Self-pay

## 2019-08-22 ENCOUNTER — Ambulatory Visit (INDEPENDENT_AMBULATORY_CARE_PROVIDER_SITE_OTHER): Payer: Medicaid Other | Admitting: Obstetrics and Gynecology

## 2019-08-22 ENCOUNTER — Other Ambulatory Visit (HOSPITAL_COMMUNITY)
Admission: RE | Admit: 2019-08-22 | Discharge: 2019-08-22 | Disposition: A | Discharge status: Home / Self Care | Payer: BC Managed Care – PPO | Source: Ambulatory Visit | Attending: Obstetrics and Gynecology | Admitting: Obstetrics and Gynecology

## 2019-08-22 ENCOUNTER — Encounter: Payer: Self-pay | Admitting: Obstetrics and Gynecology

## 2019-08-22 VITALS — BP 100/70 | Ht 61.0 in | Wt 118.0 lb

## 2019-08-22 DIAGNOSIS — Z30013 Encounter for initial prescription of injectable contraceptive: Secondary | ICD-10-CM | POA: Diagnosis not present

## 2019-08-22 DIAGNOSIS — Z113 Encounter for screening for infections with a predominantly sexual mode of transmission: Secondary | ICD-10-CM | POA: Diagnosis not present

## 2019-08-22 DIAGNOSIS — N3000 Acute cystitis without hematuria: Secondary | ICD-10-CM

## 2019-08-22 LAB — POCT URINALYSIS DIPSTICK
Bilirubin, UA: NEGATIVE
Blood, UA: NEGATIVE
Glucose, UA: NEGATIVE
Ketones, UA: NEGATIVE
Nitrite, UA: POSITIVE
Protein, UA: NEGATIVE
Spec Grav, UA: 1.015 (ref 1.010–1.025)
pH, UA: 5 (ref 5.0–8.0)

## 2019-08-22 MED ORDER — NITROFURANTOIN MONOHYD MACRO 100 MG PO CAPS: 100.0000 mg | ORAL_CAPSULE | Freq: Two times a day (BID) | ORAL | 0 refills | Status: AC

## 2019-08-22 MED ORDER — MEDROXYPROGESTERONE ACETATE 150 MG/ML IM SUSY: 150.0000 mg | PREFILLED_SYRINGE | Freq: Once | INTRAMUSCULAR | 0 refills | Status: DC

## 2019-08-22 MED ORDER — MEDROXYPROGESTERONE ACETATE 150 MG/ML IM SUSY: 150.0000 mg | PREFILLED_SYRINGE | Freq: Once | INTRAMUSCULAR | 1 refills | Status: DC

## 2019-08-22 NOTE — Progress Notes (Signed)
Mickie Bail, MD   Chief Complaint  Patient presents with  . Contraception    not sure of new BC method  . Urinary Tract Infection    frequency urinating, dark color and odor in urine    HPI:      [name] is a 19 y.o. G1P0010 whose LMP was Patient's last menstrual period was 08/17/2019 (exact date)., presents today for [facility] consult. Had IUD removed 5/21. Would like BC now. Menses are monthly, lasting 6-7 days, no BTB, mild dysmen. Has been sex active, not using condoms. Did nexplanon in past. No hx of migraines with aura, HTN, DVTs. Has not been sex active since LMP. Would also like full STD testing. No sx, known exposures.  Has had UTI sx for a few days with dysuria, frequency/urgency and urine odor. Hasn't had a UTI recently. No vag sx.   Last annual 5/21   Past Medical History:  Diagnosis Date  . Anxiety   . Depression   . Wears contact lenses     Past Surgical History:  Procedure Laterality Date  . TONSILLECTOMY    . TONSILLECTOMY AND ADENOIDECTOMY N/A 11/22/2014   Procedure: TONSILLECTOMY AND ADENOIDECTOMY;  Surgeon: Bud Face, MD;  Location: [facility];  Service: ENT;  Laterality: N/A;  ADENOIDS CAUTERIZED NO TISSUE SENT FOR PATHOLOGY    Family History  Problem Relation Age of Onset  . Anxiety disorder Father   . Depression Father     Social History   Socioeconomic History  . Marital status: Single    Spouse name: Not on file  . Number of children: 0  . Years of education: Not on file  . Highest education level: 12th grade  Occupational History    Comment: full time student  Tobacco Use  . Smoking status: Never Smoker  . Smokeless tobacco: Never Used  Vaping Use  . Vaping Use: Never used  Substance and Sexual Activity  . Alcohol use: Not Currently    Alcohol/week: 1.0 standard drink    Types: 1 Glasses of wine per week  . Drug use: Not Currently    Types: Marijuana    Comment: maybe used once a week maybe  . Sexual  activity: Yes    Birth control/protection: None  Other Topics Concern  . Not on file  Social History Narrative   Ex boyfriend   Social Determinants of Health   Financial Resource Strain:   . Difficulty of Paying Living Expenses:   Food Insecurity:   . Worried About Programme researcher, broadcasting/film/video in the Last Year:   . Barista in the Last Year:   Transportation Needs:   . Freight forwarder (Medical):   Marland Kitchen Lack of Transportation (Non-Medical):   Physical Activity:   . Days of Exercise per Week:   . Minutes of Exercise per Session:   Stress:   . Feeling of Stress :   Social Connections:   . Frequency of Communication with Friends and Family:   . Frequency of Social Gatherings with Friends and Family:   . Attends Religious Services:   . Active Member of Clubs or Organizations:   . Attends Banker Meetings:   Marland Kitchen Marital Status:   Intimate Partner Violence:   . Fear of Current or Ex-Partner:   . Emotionally Abused:   Marland Kitchen Physically Abused:   . Sexually Abused:     Outpatient Medications Prior to Visit  Medication Sig Dispense Refill  .  levonorgestrel (MIRENA) 20 MCG/24HR IUD by Intrauterine route.     No facility-administered medications prior to visit.      ROS:  Review of Systems  Constitutional: Negative for fever.  Gastrointestinal: Negative for blood in stool, constipation, diarrhea, nausea and vomiting.  Genitourinary: Positive for dysuria, frequency and urgency. Negative for dyspareunia, flank pain, hematuria, vaginal bleeding, vaginal discharge and vaginal pain.  Musculoskeletal: Negative for back pain.  Skin: Negative for rash.     OBJECTIVE:   Vitals:  BP 100/70   Ht 5\' 1"  (1.549 m)   Wt 118 lb (53.5 kg)   LMP 08/17/2019 (Exact Date)   BMI 22.30 kg/m   Physical Exam Vitals reviewed.  Constitutional:      Appearance: She is well-developed.  Pulmonary:     Effort: Pulmonary effort is normal.  Genitourinary:    General: Normal vulva.      Pubic Area: No rash.      Labia:        Right: No rash, tenderness or lesion.        Left: No rash, tenderness or lesion.      Vagina: Normal. No vaginal discharge, erythema or tenderness.     Cervix: Normal.     Uterus: Normal. Not enlarged and not tender.      Adnexa: Right adnexa normal and left adnexa normal.       Right: No mass or tenderness.         Left: No mass or tenderness.    Musculoskeletal:        General: Normal range of motion.     Cervical back: Normal range of motion.  Skin:    General: Skin is warm and dry.  Neurological:     General: No focal deficit present.     Mental Status: She is alert and oriented to person, place, and time.  Psychiatric:        Mood and Affect: Mood normal.        Behavior: Behavior normal.        Thought Content: Thought content normal.        Judgment: Judgment normal.     Results: Results for orders placed or performed in visit on 08/22/19 (from the past 24 hour(s))  POCT Urinalysis Dipstick     Status: Abnormal   Collection Time: 08/22/19 11:53 AM  Result Value Ref Range   Color, UA yellow    Clarity, UA cloudy    Glucose, UA Negative Negative   Bilirubin, UA neg    Ketones, UA neg    Spec Grav, UA 1.015 1.010 - 1.025   Blood, UA neg    pH, UA 5.0 5.0 - 8.0   Protein, UA Negative Negative   Urobilinogen, UA     Nitrite, UA pos    Leukocytes, UA Moderate (2+) (A) Negative   Appearance     Odor       Assessment/Plan: Acute cystitis without hematuria - Plan: nitrofurantoin, macrocrystal-monohydrate, (MACROBID) 100 MG capsule, POCT Urinalysis Dipstick, Urine Culture; Pos sx and UA. Rx macrobid. Check C&S. F/u prn.   Screening for STD (sexually transmitted disease) - Plan: Cervicovaginal ancillary only, HIV Antibody (routine testing w rflx), RPR, HSV 2 antibody, IgG, Hepatitis C antibody  Encounter for initial prescription of injectable contraceptive - Plan: medroxyPROGESTERone Acetate 150 MG/ML SUSY; BC options  discussed. Pt would like to try depo. Rx eRxd (1 to local [facility] for inj today, RF sent to [facility] [place]). RTO today for  injection/condoms for 7 days.    Meds ordered this encounter  Medications  . medroxyPROGESTERone Acetate 150 MG/ML SUSY    Sig: Inject 1 mL (150 mg total) into the muscle once for 1 dose.    Dispense:  1 mL    Refill:  0    Order Specific Question:   Supervising Provider    Answer:   Nadara Mustard [id number]  . nitrofurantoin, macrocrystal-monohydrate, (MACROBID) 100 MG capsule    Sig: Take 1 capsule (100 mg total) by mouth 2 (two) times daily for 5 days.    Dispense:  10 capsule    Refill:  0    Order Specific Question:   Supervising Provider    Answer:   Nadara Mustard [id number]  . medroxyPROGESTERone Acetate 150 MG/ML SUSY    Sig: Inject 1 mL (150 mg total) into the muscle once for 1 dose.    Dispense:  1 mL    Refill:  1    Order Specific Question:   Supervising Provider    Answer:   Nadara Mustard [[id number]]      Return if symptoms worsen or fail to improve, for RN appt for depo today.  Azriel Jakob B. Marcianne Ozbun, PA-C 08/22/2019 11:56 AM

## 2019-08-22 NOTE — Patient Instructions (Signed)
I value your feedback and entrusting us with your care. If you get a [facility] patient survey, I would appreciate you taking the time to let us know about your experience today. Thank you!  As of December 30, 2018, your lab results will be released to your MyChart immediately, before I even have a chance to see them. Please give me time to review them and contact you if there are any abnormalities. Thank you for your patience.  

## 2019-08-23 ENCOUNTER — Ambulatory Visit: Payer: Medicaid Other

## 2019-08-23 LAB — CERVICOVAGINAL ANCILLARY ONLY
Chlamydia: NEGATIVE
Comment: NEGATIVE
Comment: NEGATIVE
Comment: NORMAL
Neisseria Gonorrhea: NEGATIVE
Trichomonas: NEGATIVE

## 2019-08-23 LAB — HIV ANTIBODY (ROUTINE TESTING W REFLEX): HIV Screen 4th Generation wRfx: NONREACTIVE

## 2019-08-23 LAB — HSV 2 ANTIBODY, IGG: HSV 2 IgG, Type Spec: <0.91 index (ref 0.00–0.90)

## 2019-08-23 LAB — HEPATITIS C ANTIBODY: Hep C Virus Ab: 0.1 s/co ratio (ref 0.0–0.9)

## 2019-08-23 LAB — RPR: RPR Ser Ql: NONREACTIVE

## 2019-08-24 ENCOUNTER — Telehealth: Payer: Self-pay

## 2019-08-24 LAB — URINE CULTURE

## 2019-08-24 NOTE — Progress Notes (Signed)
Pls let pt know C&S showed UTI. Somewhat resistant to abx given her (macrobid). If sx improving, complete abx. If still persisting, will change abx. Thx.

## 2019-08-24 NOTE — Progress Notes (Signed)
Pt aware. Thinks abx is working. Will follow up as needed.

## 2019-08-24 NOTE — Telephone Encounter (Signed)
Pt calling to see what results mean.  [phone]  Pt aware everything is either in normal range or negative which is what we want.  Urine culture was not mentioned.

## 2019-09-13 ENCOUNTER — Ambulatory Visit
Admission: EM | Admit: 2019-09-13 | Discharge: 2019-09-13 | Disposition: A | Discharge status: Home / Self Care | Payer: Medicaid Other | Attending: Urgent Care | Admitting: Urgent Care

## 2019-09-13 ENCOUNTER — Other Ambulatory Visit: Payer: Self-pay

## 2019-09-13 DIAGNOSIS — G8929 Other chronic pain: Secondary | ICD-10-CM

## 2019-09-13 DIAGNOSIS — M25561 Pain in right knee: Secondary | ICD-10-CM | POA: Diagnosis not present

## 2019-09-13 DIAGNOSIS — S8991XA Unspecified injury of right lower leg, initial encounter: Secondary | ICD-10-CM | POA: Diagnosis not present

## 2019-09-13 MED ORDER — NAPROXEN 500 MG PO TABS: 500.0000 mg | ORAL_TABLET | Freq: Two times a day (BID) | ORAL | 0 refills | Status: DC
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
## 2019-09-13 NOTE — Discharge Instructions (Signed)
Please contact [facility] for consultation regarding your chronic knee pain.  The goal should be to get an MRI and evaluate further for ligamentous or meniscus injury.  In the meantime use the Ace wrap on days that you do not do a lot of walking, naproxen for pain and inflammation.

## 2019-09-14 ENCOUNTER — Ambulatory Visit: Payer: Medicaid Other | Admitting: Obstetrics and Gynecology

## 2019-09-14 ENCOUNTER — Encounter: Payer: Self-pay | Admitting: Family Medicine

## 2019-09-14 ENCOUNTER — Ambulatory Visit (INDEPENDENT_AMBULATORY_CARE_PROVIDER_SITE_OTHER): Payer: BC Managed Care – PPO | Admitting: Family Medicine

## 2019-09-14 VITALS — BP 94/62 | Ht 61.0 in | Wt 126.0 lb

## 2019-09-14 DIAGNOSIS — M25561 Pain in right knee: Secondary | ICD-10-CM | POA: Diagnosis not present

## 2019-09-14 DIAGNOSIS — G8929 Other chronic pain: Secondary | ICD-10-CM | POA: Diagnosis not present

## 2019-09-14 NOTE — Assessment & Plan Note (Signed)
Given the history, mechanism of injury, clinical exam I am concerned for possible meniscal tear but also partial tear of the ACL tendon.  Given that she has instability and giving way of her knee and continued swelling 5 months away from the injury and is otherwise healthy this needs further evaluation with MRI.  After MRI we will address her hamstring weakness with proper home exercises but will await the results of MRI before we recommend any specific physical therapy. - Obtain MRI of the right knee and follow-up after.
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
## 2019-09-14 NOTE — Patient Instructions (Addendum)
It was great to meet you today! Thank you for letting me participate in your care!  Today, we discussed your right knee swelling and continued pain and after examining you today we are concerned about an injury to your meniscus. Given this has been going on for so long with symptoms of instability we need to get an MRI of your knee to see the extent of the injury. We will get this scheduled for you and then have you return to clinic after the MRI is complete to discuss to results.  Be well, Jules Schick, DO PGY-4, Sports Medicine Fellow [facility]'

## 2019-10-12 ENCOUNTER — Other Ambulatory Visit: Payer: Self-pay

## 2019-10-12 ENCOUNTER — Ambulatory Visit
Admission: RE | Admit: 2019-10-12 | Discharge: 2019-10-12 | Disposition: A | Discharge status: Home / Self Care | Payer: BC Managed Care – PPO | Source: Ambulatory Visit | Attending: Family Medicine | Admitting: Family Medicine

## 2019-10-12 DIAGNOSIS — G8929 Other chronic pain: Secondary | ICD-10-CM

## 2019-10-13 ENCOUNTER — Other Ambulatory Visit: Payer: Self-pay

## 2019-10-13 DIAGNOSIS — G8929 Other chronic pain: Secondary | ICD-10-CM

## 2019-10-26 ENCOUNTER — Ambulatory Visit (INDEPENDENT_AMBULATORY_CARE_PROVIDER_SITE_OTHER): Payer: BC Managed Care – PPO | Admitting: Orthopedic Surgery

## 2019-10-26 ENCOUNTER — Encounter: Payer: Self-pay | Admitting: Orthopedic Surgery

## 2019-10-26 DIAGNOSIS — S83271A Complex tear of lateral meniscus, current injury, right knee, initial encounter: Secondary | ICD-10-CM
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
## 2019-10-28 ENCOUNTER — Telehealth: Payer: Self-pay

## 2019-10-28 NOTE — Telephone Encounter (Signed)
FYI  Pt called and stated she hasnt heard anything about scheduling surgery. I informed her that she was on a list to be called and would hopefully be called soon to be scheduled. Pt stated understanding

## 2019-10-28 NOTE — Telephone Encounter (Signed)
Enter in error

## 2019-10-31 ENCOUNTER — Telehealth: Payer: Self-pay | Admitting: Orthopedic Surgery

## 2019-10-31 NOTE — Telephone Encounter (Signed)
Patient is scheduled for right knee arthroscopy, debridement at 3:15pm on 11-15-19 at [facility] with Dr. Dorene Grebe.  Patient needs to go for Covid test on 11-11-19 at 2pm.  Post op visit is set for 11-23-19 @ 1:30pm.  An email with instructions, along with all the above information has been sent to patient's email address on file because several attempts have been made to contact her, however no answer and voice mail is full.

## 2019-10-31 NOTE — Telephone Encounter (Signed)
FYI

## 2019-11-01 ENCOUNTER — Other Ambulatory Visit: Payer: Self-pay

## 2019-11-11 ENCOUNTER — Other Ambulatory Visit (HOSPITAL_COMMUNITY): Payer: BC Managed Care – PPO

## 2019-11-15 ENCOUNTER — Encounter (HOSPITAL_COMMUNITY): Admission: RE | Payer: Self-pay | Source: Home / Self Care

## 2019-11-15 ENCOUNTER — Ambulatory Visit (HOSPITAL_COMMUNITY)
Admission: RE | Admit: 2019-11-15 | Payer: BC Managed Care – PPO | Source: Home / Self Care | Admitting: Orthopedic Surgery

## 2019-11-15 SURGERY — ARTHROSCOPY, KNEE
Anesthesia: Choice | Site: Knee | Laterality: Right

## 2019-11-23 ENCOUNTER — Inpatient Hospital Stay: Payer: BC Managed Care – PPO | Admitting: Orthopedic Surgery

## 2020-02-02 ENCOUNTER — Other Ambulatory Visit: Payer: BC Managed Care – PPO

## 2020-02-03 ENCOUNTER — Other Ambulatory Visit: Payer: 59

## 2020-02-09 ENCOUNTER — Other Ambulatory Visit: Payer: 59

## 2020-02-17 ENCOUNTER — Other Ambulatory Visit: Payer: Self-pay

## 2020-02-17 ENCOUNTER — Ambulatory Visit
Admission: RE | Admit: 2020-02-17 | Discharge: 2020-02-17 | Disposition: A | Discharge status: Home / Self Care | Payer: 59 | Source: Ambulatory Visit | Attending: Family Medicine | Admitting: Family Medicine

## 2020-02-17 VITALS — BP 117/72 | HR 93 | Temp 98.8°F | Resp 16

## 2020-02-17 DIAGNOSIS — B9689 Other specified bacterial agents as the cause of diseases classified elsewhere: Secondary | ICD-10-CM

## 2020-02-17 DIAGNOSIS — N760 Acute vaginitis: Secondary | ICD-10-CM

## 2020-02-17 DIAGNOSIS — B3731 Acute candidiasis of vulva and vagina: Secondary | ICD-10-CM

## 2020-02-17 DIAGNOSIS — B373 Candidiasis of vulva and vagina: Secondary | ICD-10-CM

## 2020-02-17 LAB — URINALYSIS, COMPLETE (UACMP) WITH MICROSCOPIC
Bilirubin Urine: NEGATIVE
Glucose, UA: NEGATIVE mg/dL
Ketones, ur: NEGATIVE mg/dL
Nitrite: NEGATIVE
Protein, ur: NEGATIVE mg/dL
Specific Gravity, Urine: 1.025 (ref 1.005–1.030)
pH: 6 (ref 5.0–8.0)

## 2020-02-17 LAB — WET PREP, GENITAL
Sperm: NONE SEEN
Trich, Wet Prep: NONE SEEN

## 2020-02-17 LAB — CHLAMYDIA/NGC RT PCR (ARMC ONLY)
Chlamydia Tr: NOT DETECTED
N gonorrhoeae: DETECTED — AB

## 2020-02-17 LAB — PREGNANCY, URINE: Preg Test, Ur: NEGATIVE

## 2020-02-17 MED ORDER — FLUCONAZOLE 150 MG PO TABS: 150.0000 mg | ORAL_TABLET | Freq: Once | ORAL | 0 refills | Status: AC

## 2020-02-17 MED ORDER — METRONIDAZOLE 500 MG PO TABS: 500.0000 mg | ORAL_TABLET | Freq: Two times a day (BID) | ORAL | 0 refills | Status: DC

## 2020-02-17 MED ORDER — METRONIDAZOLE 0.75 % VA GEL: 1.0000 | Freq: Every day | VAGINAL | 0 refills | Status: AC

## 2020-02-17 NOTE — ED Triage Notes (Signed)
Pt is here with vaginal itching, burning, discharge with frequency to urinate that started 2 days ago, pt has not taken any meds to relieve discomfort.

## 2020-02-17 NOTE — ED Provider Notes (Signed)
[facility]    CSN: [id number] Arrival date & time: 02/17/20  1000      History   Chief Complaint Chief Complaint  Patient presents with  . (APPT 10AM)UTI/Pregnancy test   HPI  20 year old female presents with the above complaints.  Patient reports that she has had vaginal discharge over the past 2 days.  Has had some associated itching.  Denies odor.  Has now developed dysuria and urinary frequency.  She states that she is unsure whether she has UTI or not.  Patient states that her menstrual cycles have not returned since stopping Depo-Provera in November.  She is concerned about this as well.  She is sexually active and having unprotected intercourse.  He is not particular concern for STDs but wants testing today.  No other reported symptoms.  No other complaints.  Past Medical History:  Diagnosis Date  . Anxiety   . Depression   . Wears contact lenses     Patient Active Problem List   Diagnosis Date Noted  . Right knee pain 09/14/2019  . Marijuana abuse 12/31/2017  . 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 MDD (major depressive disorder), recurrent episode, moderate (HCC) 12/08/2017  . Other constipation 03/28/2016  . Vaginal discharge 03/28/2016  . Bacterial vaginosis 07/23/2014  . Recurrent UTI 07/23/2014    Past Surgical History:  Procedure Laterality Date  . TONSILLECTOMY    . TONSILLECTOMY AND ADENOIDECTOMY N/A 11/22/2014   Procedure: TONSILLECTOMY AND ADENOIDECTOMY;  Surgeon: Bud Face, MD;  Location: [facility];  Service: ENT;  Laterality: N/A;  ADENOIDS CAUTERIZED NO TISSUE SENT FOR PATHOLOGY    OB History    Gravida  1   Para      Term      Preterm      AB  1   Living        SAB  1   IAB      Ectopic      Multiple      Live Births               Home Medications    Prior to Admission medications   Medication Sig Start Date End Date Taking? Authorizing Provider  fluconazole (DIFLUCAN) 150 MG  tablet Take 1 tablet (150 mg total) by mouth once for 1 dose. Repeat dose in 72 hours. 02/17/20 02/17/20 Yes Tonnya Garbett G, DO  metroNIDAZOLE (METROGEL VAGINAL) 0.75 % vaginal gel Place 1 Applicatorful vaginally at bedtime for 5 days. Cancel Rx for PO Flagyl. 02/17/20 02/22/20 Yes Rebekka Lobello G, DO  medroxyPROGESTERone Acetate 150 MG/ML SUSY Inject 1 mL (150 mg total) into the muscle once for 1 dose. 08/22/19 09/13/19  Copland, Ilona Sorrel, PA-C  medroxyPROGESTERone Acetate 150 MG/ML SUSY Inject 1 mL (150 mg total) into the muscle once for 1 dose. 08/22/19 08/22/19  Copland, Ilona Sorrel, PA-C  naproxen (NAPROSYN) 500 MG tablet Take 1 tablet (500 mg total) by mouth 2 (two) times daily with a meal. 09/13/19   Wallis Bamberg, PA-C  albuterol (PROVENTIL) (2.5 MG/3ML) 0.083% nebulizer solution Take 3 mLs (2.5 mg total) by nebulization every 6 (six) hours as needed for wheezing or shortness of breath. 03/12/18 11/06/18  Linus Mako B, NP  ipratropium (ATROVENT) 0.06 % nasal spray Place 2 sprays into both nostrils 3 (three) times daily as needed for rhinitis. 11/06/18 11/25/18  Georgetta Haber, NP    Family History Family History  Problem Relation Age of Onset  . Healthy Mother   .  Anxiety disorder Father   . Depression Father   . Healthy Father     Social History Social History   Tobacco Use  . Smoking status: Never Smoker  . Smokeless tobacco: Never Used  Vaping Use  . Vaping Use: Never used  Substance Use Topics  . Alcohol use: Not Currently    Alcohol/week: 1.0 standard drink    Types: 1 Glasses of wine per week  . Drug use: Yes    Frequency: 1.0 times per week    Types: Marijuana     Allergies   Apple   Review of Systems Review of Systems  Constitutional: Negative.   Genitourinary: Positive for dysuria and vaginal discharge.   Physical Exam Triage Vital Signs ED Triage Vitals  Enc Vitals Group     BP 02/17/20 1010 117/72     Pulse Rate 02/17/20 1010 93     Resp 02/17/20 1010 16     Temp  02/17/20 1010 98.8 F (37.1 C)     Temp Source 02/17/20 1010 Oral     SpO2 02/17/20 1010 100 %     Weight --      Height --      Head Circumference --      Peak Flow --      Pain Score 02/17/20 1008 0     Pain Loc --      Pain Edu? --      Excl. in GC? --    Updated Vital Signs BP 117/72 (BP Location: Left Arm)   Pulse 93   Temp 98.8 F (37.1 C) (Oral)   Resp 16   LMP  (LMP Unknown)   SpO2 100%   Visual Acuity Right Eye Distance:   Left Eye Distance:   Bilateral Distance:    Right Eye Near:   Left Eye Near:    Bilateral Near:     Physical Exam Vitals and nursing note reviewed.  Constitutional:      General: She is not in acute distress.    Appearance: Normal appearance. She is not ill-appearing.  HENT:     Head: Normocephalic and atraumatic.  Cardiovascular:     Rate and Rhythm: Normal rate and regular rhythm.     Heart sounds: No murmur heard.   Pulmonary:     Effort: Pulmonary effort is normal.     Breath sounds: Normal breath sounds. No wheezing, rhonchi or rales.  Abdominal:     General: There is no distension.     Palpations: Abdomen is soft.     Tenderness: There is no abdominal tenderness.  Neurological:     Mental Status: She is alert.  Psychiatric:        Mood and Affect: Mood normal.        Behavior: Behavior normal.    UC Treatments / Results  Labs (all labs ordered are listed, but only abnormal results are displayed) Labs Reviewed  WET PREP, GENITAL - Abnormal; Notable for the following components:      Result Value   Yeast Wet Prep HPF POC PRESENT (*)    Clue Cells Wet Prep HPF POC PRESENT (*)    WBC, Wet Prep HPF POC FEW (*)    All other components within normal limits  URINALYSIS, COMPLETE (UACMP) WITH MICROSCOPIC - Abnormal; Notable for the following components:   Hgb urine dipstick TRACE (*)    Leukocytes,Ua SMALL (*)    Bacteria, UA MANY (*)    All other components within normal limits  CHLAMYDIA/NGC RT PCR ([facility]  ONLY)  URINE CULTURE  PREGNANCY, URINE    EKG   Radiology No results found.  Procedures Procedures (including critical care time)  Medications Ordered in UC Medications - No data to display  Initial Impression / Assessment and Plan / UC Course  I have reviewed the triage vital signs and the nursing notes.  Pertinent labs & imaging results that were available during my care of the patient were reviewed by me and considered in my medical decision making (see chart for details).    20 year old female presents with yeast vaginitis and BV. Treating with flagyl & diflucan.   Final Clinical Impressions(s) / UC Diagnoses   Final diagnoses:  Yeast vaginitis  Bacterial vaginosis   Discharge Instructions   None    ED Prescriptions    Medication Sig Dispense Auth. Provider   fluconazole (DIFLUCAN) 150 MG tablet Take 1 tablet (150 mg total) by mouth once for 1 dose. Repeat dose in 72 hours. 2 tablet Keyante Durio G, DO   metroNIDAZOLE (FLAGYL) 500 MG tablet  (Status: Discontinued) Take 1 tablet (500 mg total) by mouth 2 (two) times daily. 14 tablet Jann Milkovich G, DO   metroNIDAZOLE (METROGEL VAGINAL) 0.75 % vaginal gel Place 1 Applicatorful vaginally at bedtime for 5 days. Cancel Rx for PO Flagyl. 70 g Tommie Sams, DO     PDMP not reviewed this encounter.   Tommie Sams, [place] 02/17/20 1219

## 2020-02-18 ENCOUNTER — Other Ambulatory Visit: Payer: Self-pay

## 2020-02-18 ENCOUNTER — Ambulatory Visit
Admission: EM | Admit: 2020-02-18 | Discharge: 2020-02-18 | Disposition: A | Discharge status: Home / Self Care | Payer: 59 | Attending: Family Medicine | Admitting: Family Medicine

## 2020-02-18 DIAGNOSIS — A549 Gonococcal infection, unspecified: Secondary | ICD-10-CM

## 2020-02-18 MED ORDER — CEFTRIAXONE SODIUM 500 MG IJ SOLR
500.0000 mg | Freq: Once | INTRAMUSCULAR | Status: AC
  Administered 2020-02-18: 500 mg via INTRAMUSCULAR

## 2020-02-18 NOTE — ED Triage Notes (Signed)
Patient is here for a rocephin injection due to gonorrhea infection.

## 2020-03-08 ENCOUNTER — Other Ambulatory Visit: Payer: Self-pay

## 2020-03-27 ENCOUNTER — Encounter (HOSPITAL_BASED_OUTPATIENT_CLINIC_OR_DEPARTMENT_OTHER): Payer: Self-pay | Admitting: Orthopedic Surgery

## 2020-03-27 ENCOUNTER — Other Ambulatory Visit: Payer: Self-pay

## 2020-03-29 ENCOUNTER — Other Ambulatory Visit (HOSPITAL_COMMUNITY)
Admission: RE | Admit: 2020-03-29 | Discharge: 2020-03-29 | Disposition: A | Discharge status: Home / Self Care | Payer: Medicaid Other | Source: Ambulatory Visit | Attending: Orthopedic Surgery | Admitting: Orthopedic Surgery

## 2020-03-29 ENCOUNTER — Encounter (HOSPITAL_BASED_OUTPATIENT_CLINIC_OR_DEPARTMENT_OTHER)
Admission: RE | Admit: 2020-03-29 | Discharge: 2020-03-29 | Disposition: A | Discharge status: Home / Self Care | Payer: 59 | Source: Ambulatory Visit | Attending: Orthopedic Surgery | Admitting: Orthopedic Surgery

## 2020-03-29 DIAGNOSIS — Z01812 Encounter for preprocedural laboratory examination: Secondary | ICD-10-CM | POA: Insufficient documentation

## 2020-03-29 DIAGNOSIS — Z20822 Contact with and (suspected) exposure to covid-19: Secondary | ICD-10-CM | POA: Insufficient documentation

## 2020-03-29 LAB — POCT PREGNANCY, URINE: Preg Test, Ur: NEGATIVE

## 2020-03-29 LAB — SARS CORONAVIRUS 2 (TAT 6-24 HRS): SARS Coronavirus 2: NEGATIVE

## 2020-03-29 NOTE — Progress Notes (Signed)

## 2020-04-01 NOTE — Anesthesia Preprocedure Evaluation (Addendum)
Anesthesia Evaluation  Patient identified by MRN, date of birth, ID band Patient awake    Reviewed: Allergy & Precautions, NPO status , Patient's Chart, lab work & pertinent test results  Airway Mallampati: II  TM Distance: >3 FB     Dental   Pulmonary neg pulmonary ROS, Patient abstained from smoking.,    breath sounds clear to auscultation       Cardiovascular negative cardio ROS   Rhythm:Regular Rate:Normal     Neuro/Psych PSYCHIATRIC DISORDERS Anxiety Depression    GI/Hepatic negative GI ROS, Neg liver ROS,   Endo/Other  negative endocrine ROS  Renal/GU      Musculoskeletal   Abdominal   Peds  Hematology   Anesthesia Other Findings   Reproductive/Obstetrics                            Anesthesia Physical Anesthesia Plan  ASA: II  Anesthesia Plan: General   Post-op Pain Management:    Induction: Intravenous  PONV Risk Score and Plan: 3 and Ondansetron, Dexamethasone and Midazolam  Airway Management Planned: LMA  Additional Equipment:   Intra-op Plan:   Post-operative Plan: Extubation in OR  Informed Consent: I have reviewed the patients History and Physical, chart, labs and discussed the procedure including the risks, benefits and alternatives for the proposed anesthesia with the patient or authorized representative who has indicated his/her understanding and acceptance.     Dental advisory given  Plan Discussed with: CRNA and Anesthesiologist  Anesthesia Plan Comments:         Anesthesia Quick Evaluation

## 2020-04-02 ENCOUNTER — Other Ambulatory Visit: Payer: Self-pay

## 2020-04-02 ENCOUNTER — Encounter (HOSPITAL_BASED_OUTPATIENT_CLINIC_OR_DEPARTMENT_OTHER): Payer: Self-pay | Admitting: Orthopedic Surgery

## 2020-04-02 ENCOUNTER — Ambulatory Visit (HOSPITAL_BASED_OUTPATIENT_CLINIC_OR_DEPARTMENT_OTHER)
Admission: RE | Admit: 2020-04-02 | Discharge: 2020-04-02 | Disposition: A | Discharge status: Home / Self Care | Payer: Medicaid Other | Attending: Orthopedic Surgery | Admitting: Orthopedic Surgery

## 2020-04-02 ENCOUNTER — Ambulatory Visit (HOSPITAL_BASED_OUTPATIENT_CLINIC_OR_DEPARTMENT_OTHER): Payer: Medicaid Other | Admitting: Anesthesiology

## 2020-04-02 ENCOUNTER — Encounter: Payer: Self-pay | Admitting: Orthopedic Surgery

## 2020-04-02 ENCOUNTER — Encounter (HOSPITAL_BASED_OUTPATIENT_CLINIC_OR_DEPARTMENT_OTHER)
Admission: RE | Disposition: A | Discharge status: Home / Self Care | Payer: Self-pay | Source: Home / Self Care | Attending: Orthopedic Surgery

## 2020-04-02 DIAGNOSIS — S83271D Complex tear of lateral meniscus, current injury, right knee, subsequent encounter: Secondary | ICD-10-CM

## 2020-04-02 DIAGNOSIS — S83271A Complex tear of lateral meniscus, current injury, right knee, initial encounter: Secondary | ICD-10-CM | POA: Insufficient documentation

## 2020-04-02 HISTORY — PX: KNEE ARTHROSCOPY WITH LATERAL MENISECTOMY: SHX6193

## 2020-04-02 HISTORY — PX: KNEE ARTHROSCOPY: SHX127

## 2020-04-02 SURGERY — ARTHROSCOPY, KNEE
Anesthesia: General | Site: Knee | Laterality: Right

## 2020-04-02 MED ORDER — LACTATED RINGERS IV SOLN: INTRAVENOUS | Status: DC

## 2020-04-02 MED ORDER — PROPOFOL 10 MG/ML IV BOLUS
INTRAVENOUS | Status: AC
  Filled 2020-04-02: qty 40

## 2020-04-02 MED ORDER — POVIDONE-IODINE 7.5 % EX SOLN
Freq: Once | CUTANEOUS | Status: DC
  Filled 2020-04-02: qty 118

## 2020-04-02 MED ORDER — EPINEPHRINE PF 1 MG/ML IJ SOLN
INTRAMUSCULAR | Status: AC
  Filled 2020-04-02: qty 8

## 2020-04-02 MED ORDER — ONDANSETRON HCL 4 MG/2ML IJ SOLN
INTRAMUSCULAR | Status: AC
  Filled 2020-04-02: qty 2

## 2020-04-02 MED ORDER — CEFAZOLIN SODIUM-DEXTROSE 2-3 GM-%(50ML) IV SOLR
INTRAVENOUS | Status: DC | PRN
  Administered 2020-04-02: 2 g via INTRAVENOUS

## 2020-04-02 MED ORDER — METHOCARBAMOL 500 MG PO TABS: 500.0000 mg | ORAL_TABLET | Freq: Three times a day (TID) | ORAL | 0 refills | Status: DC | PRN

## 2020-04-02 MED ORDER — KETOROLAC TROMETHAMINE 30 MG/ML IJ SOLN
30.0000 mg | Freq: Once | INTRAMUSCULAR | Status: AC
  Administered 2020-04-02: 30 mg via INTRAVENOUS

## 2020-04-02 MED ORDER — LACTATED RINGERS IV SOLN: INTRAVENOUS | Status: DC | PRN

## 2020-04-02 MED ORDER — CEFAZOLIN SODIUM-DEXTROSE 2-4 GM/100ML-% IV SOLN: 2.0000 g | INTRAVENOUS | Status: DC

## 2020-04-02 MED ORDER — CEFAZOLIN SODIUM-DEXTROSE 2-4 GM/100ML-% IV SOLN
INTRAVENOUS | Status: AC
  Filled 2020-04-02: qty 100

## 2020-04-02 MED ORDER — DEXAMETHASONE SODIUM PHOSPHATE 10 MG/ML IJ SOLN
INTRAMUSCULAR | Status: DC | PRN
  Administered 2020-04-02: 5 mg via INTRAVENOUS

## 2020-04-02 MED ORDER — FENTANYL CITRATE (PF) 100 MCG/2ML IJ SOLN
INTRAMUSCULAR | Status: AC
  Filled 2020-04-02: qty 2

## 2020-04-02 MED ORDER — POVIDONE-IODINE 10 % EX SWAB
2.0000 "application " | Freq: Once | CUTANEOUS | Status: AC
  Administered 2020-04-02: 2 via TOPICAL

## 2020-04-02 MED ORDER — KETOROLAC TROMETHAMINE 10 MG PO TABS: 10.0000 mg | ORAL_TABLET | Freq: Three times a day (TID) | ORAL | 0 refills | Status: DC | PRN

## 2020-04-02 MED ORDER — OXYCODONE-ACETAMINOPHEN 5-325 MG PO TABS: 1.0000 | ORAL_TABLET | ORAL | 0 refills | Status: DC | PRN

## 2020-04-02 MED ORDER — MIDAZOLAM HCL 2 MG/2ML IJ SOLN
INTRAMUSCULAR | Status: AC
  Filled 2020-04-02: qty 2

## 2020-04-02 MED ORDER — BUPIVACAINE HCL (PF) 0.5 % IJ SOLN
INTRAMUSCULAR | Status: AC
  Filled 2020-04-02: qty 60

## 2020-04-02 MED ORDER — KETOROLAC TROMETHAMINE 30 MG/ML IJ SOLN
INTRAMUSCULAR | Status: AC
  Filled 2020-04-02: qty 1

## 2020-04-02 MED ORDER — DEXAMETHASONE SODIUM PHOSPHATE 10 MG/ML IJ SOLN
INTRAMUSCULAR | Status: AC
  Filled 2020-04-02: qty 1

## 2020-04-02 MED ORDER — ONDANSETRON HCL 4 MG/2ML IJ SOLN
INTRAMUSCULAR | Status: DC | PRN
  Administered 2020-04-02: 4 mg via INTRAVENOUS

## 2020-04-02 MED ORDER — BUPIVACAINE-EPINEPHRINE 0.25% -1:200000 IJ SOLN
INTRAMUSCULAR | Status: DC | PRN
  Administered 2020-04-02: 10 mL

## 2020-04-02 MED ORDER — DEXMEDETOMIDINE HCL 200 MCG/2ML IV SOLN
INTRAVENOUS | Status: DC | PRN
  Administered 2020-04-02 (×2): 8 ug via INTRAVENOUS

## 2020-04-02 MED ORDER — FENTANYL CITRATE (PF) 100 MCG/2ML IJ SOLN: 25.0000 ug | INTRAMUSCULAR | Status: DC | PRN

## 2020-04-02 MED ORDER — METHYLPREDNISOLONE ACETATE 40 MG/ML IJ SUSP
INTRAMUSCULAR | Status: AC
  Filled 2020-04-02: qty 2

## 2020-04-02 MED ORDER — CLONIDINE HCL (ANALGESIA) 100 MCG/ML EP SOLN
EPIDURAL | Status: AC
  Filled 2020-04-02: qty 20

## 2020-04-02 MED ORDER — BUPIVACAINE HCL (PF) 0.25 % IJ SOLN
INTRAMUSCULAR | Status: AC
  Filled 2020-04-02: qty 60

## 2020-04-02 MED ORDER — LIDOCAINE 2% (20 MG/ML) 5 ML SYRINGE
INTRAMUSCULAR | Status: AC
  Filled 2020-04-02: qty 5

## 2020-04-02 MED ORDER — OXYCODONE HCL 5 MG PO TABS
ORAL_TABLET | ORAL | Status: AC
  Filled 2020-04-02: qty 1

## 2020-04-02 MED ORDER — LIDOCAINE HCL (CARDIAC) PF 100 MG/5ML IV SOSY
PREFILLED_SYRINGE | INTRAVENOUS | Status: DC | PRN
  Administered 2020-04-02: 80 mg via INTRAVENOUS

## 2020-04-02 MED ORDER — PROPOFOL 10 MG/ML IV BOLUS
INTRAVENOUS | Status: DC | PRN
  Administered 2020-04-02: 150 mg via INTRAVENOUS
  Administered 2020-04-02: 50 mg via INTRAVENOUS

## 2020-04-02 MED ORDER — BUPIVACAINE-EPINEPHRINE (PF) 0.25% -1:200000 IJ SOLN
INTRAMUSCULAR | Status: AC
  Filled 2020-04-02: qty 30

## 2020-04-02 MED ORDER — FENTANYL CITRATE (PF) 100 MCG/2ML IJ SOLN
INTRAMUSCULAR | Status: DC | PRN
  Administered 2020-04-02: 100 ug via INTRAVENOUS
  Administered 2020-04-02 (×2): 50 ug via INTRAVENOUS

## 2020-04-02 MED ORDER — MORPHINE SULFATE (PF) 4 MG/ML IV SOLN
INTRAVENOUS | Status: DC | PRN
  Administered 2020-04-02: 8 mg via INTRAMUSCULAR

## 2020-04-02 MED ORDER — OXYCODONE HCL 5 MG PO TABS
5.0000 mg | ORAL_TABLET | Freq: Once | ORAL | Status: AC
  Administered 2020-04-02: 5 mg via ORAL

## 2020-04-02 MED ORDER — ASPIRIN 81 MG PO CHEW: 81.0000 mg | CHEWABLE_TABLET | Freq: Every day | ORAL | 0 refills | Status: AC

## 2020-04-02 MED ORDER — MORPHINE SULFATE (PF) 4 MG/ML IV SOLN
INTRAVENOUS | Status: AC
  Filled 2020-04-02: qty 4

## 2020-04-02 MED ORDER — MIDAZOLAM HCL 2 MG/2ML IJ SOLN
INTRAMUSCULAR | Status: DC | PRN
  Administered 2020-04-02: 2 mg via INTRAVENOUS

## 2020-04-02 SURGICAL SUPPLY — 57 items
BANDAGE ESMARK 6X9 LF (GAUZE/BANDAGES/DRESSINGS) IMPLANT
BLADE EXCALIBUR 4.0X13 (MISCELLANEOUS) IMPLANT
BLADE SURG 15 STRL LF DISP TIS (BLADE) IMPLANT
BLADE SURG 15 STRL SS (BLADE)
BNDG CMPR 9X6 STRL LF SNTH (GAUZE/BANDAGES/DRESSINGS)
BNDG ELASTIC 4X5.8 VLCR STR LF (GAUZE/BANDAGES/DRESSINGS) ×2 IMPLANT
BNDG ELASTIC 6X5.8 VLCR STR LF (GAUZE/BANDAGES/DRESSINGS) ×2 IMPLANT
BNDG ESMARK 6X9 LF (GAUZE/BANDAGES/DRESSINGS)
COVER WAND RF STERILE (DRAPES) IMPLANT
CUFF TOURN SGL QUICK 34 (TOURNIQUET CUFF) ×2
CUFF TRNQT CYL 34X4.125X (TOURNIQUET CUFF) ×1 IMPLANT
DISSECTOR  3.8MM X 13CM (MISCELLANEOUS) ×1
DISSECTOR 3.8MM X 13CM (MISCELLANEOUS) ×1 IMPLANT
DRAPE ARTHROSCOPY W/POUCH 90 (DRAPES) ×2 IMPLANT
DRAPE IMP U-DRAPE 54X76 (DRAPES) ×2 IMPLANT
DRAPE INCISE IOBAN 66X45 STRL (DRAPES) IMPLANT
DRAPE U-SHAPE 47X51 STRL (DRAPES) ×4 IMPLANT
DRSG TEGADERM 2-3/8X2-3/4 SM (GAUZE/BANDAGES/DRESSINGS) IMPLANT
DRSG TEGADERM 4X4.75 (GAUZE/BANDAGES/DRESSINGS) ×4 IMPLANT
DURAPREP 26ML APPLICATOR (WOUND CARE) ×2 IMPLANT
DW OUTFLOW CASSETTE/TUBE SET (MISCELLANEOUS) ×2 IMPLANT
ELECT REM PT RETURN 9FT ADLT (ELECTROSURGICAL)
ELECTRODE REM PT RTRN 9FT ADLT (ELECTROSURGICAL) IMPLANT
EXCALIBUR 3.8MM X 13CM (MISCELLANEOUS) IMPLANT
GAUZE 4X4 16PLY RFD (DISPOSABLE) IMPLANT
GAUZE SPONGE 4X4 12PLY STRL (GAUZE/BANDAGES/DRESSINGS) ×2 IMPLANT
GAUZE XEROFORM 1X8 LF (GAUZE/BANDAGES/DRESSINGS) ×2 IMPLANT
GLOVE SRG 8 PF TXTR STRL LF DI (GLOVE) ×1 IMPLANT
GLOVE SURG ENC MOIS LTX SZ7 (GLOVE) ×4 IMPLANT
GLOVE SURG ORTHO LTX SZ8 (GLOVE) ×2 IMPLANT
GLOVE SURG UNDER POLY LF SZ7 (GLOVE) ×4 IMPLANT
GLOVE SURG UNDER POLY LF SZ8 (GLOVE) ×2
GOWN STRL REUS W/ TWL LRG LVL3 (GOWN DISPOSABLE) ×3 IMPLANT
GOWN STRL REUS W/ TWL XL LVL3 (GOWN DISPOSABLE) ×1 IMPLANT
GOWN STRL REUS W/TWL LRG LVL3 (GOWN DISPOSABLE) ×6
GOWN STRL REUS W/TWL XL LVL3 (GOWN DISPOSABLE) ×2
HOLDER KNEE FOAM BLUE (MISCELLANEOUS) IMPLANT
MANIFOLD NEPTUNE II (INSTRUMENTS) ×2 IMPLANT
NDL SAFETY ECLIPSE 18X1.5 (NEEDLE) ×2 IMPLANT
NEEDLE HYPO 18GX1.5 BLUNT FILL (NEEDLE) ×2 IMPLANT
NEEDLE HYPO 18GX1.5 SHARP (NEEDLE) ×4
PACK ARTHROSCOPY DSU (CUSTOM PROCEDURE TRAY) ×2 IMPLANT
PACK BASIN DAY SURGERY FS (CUSTOM PROCEDURE TRAY) ×2 IMPLANT
PADDING CAST COTTON 6X4 STRL (CAST SUPPLIES) ×2 IMPLANT
PENCIL SMOKE EVACUATOR (MISCELLANEOUS) IMPLANT
PORT APPOLLO RF 90DEGREE MULTI (SURGICAL WAND) ×2 IMPLANT
SPONGE LAP 4X18 RFD (DISPOSABLE) IMPLANT
SUT ETHILON 3 0 PS 1 (SUTURE) ×2 IMPLANT
SUT MENISCAL KIT (KITS) IMPLANT
SUT VIC AB 3-0 FS2 27 (SUTURE) IMPLANT
SUT VICRYL 0 UR6 27IN ABS (SUTURE) IMPLANT
SYR 5ML LL (SYRINGE) ×2 IMPLANT
SYR TB 1ML LL NO SAFETY (SYRINGE) ×2 IMPLANT
TOWEL GREEN STERILE FF (TOWEL DISPOSABLE) ×2 IMPLANT
TRAY DSU PREP LF (CUSTOM PROCEDURE TRAY) ×2 IMPLANT
TUBING ARTHROSCOPY IRRIG 16FT (MISCELLANEOUS) ×2 IMPLANT
WRAP KNEE MAXI GEL POST OP (GAUZE/BANDAGES/DRESSINGS) ×2 IMPLANT

## 2020-04-02 NOTE — Brief Op Note (Signed)
   04/02/2020  9:37 AM  PATIENT:  Kimberly Roach  19 y.o. female  PRE-OPERATIVE DIAGNOSIS:  right knee lateral meniscal tear  POST-OPERATIVE DIAGNOSIS:  right knee lateral meniscal tear  PROCEDURE:  Procedure(s): RIGHT KNEE ARTHROSCOPY, DEBRIDEMENT KNEE ARTHROSCOPY WITH LATERAL Partial MENISECTOMY  SURGEON:  Surgeon(s): Cammy Copa, MD  ASSISTANT: magnant pa  ANESTHESIA:   general  EBL: 5 ml    Total I/O In: 300 [I.V.:300] Out: 15 [Blood:15]  BLOOD ADMINISTERED: none  DRAINS: none   LOCAL MEDICATIONS USED: Marcaine morphine clonidine  SPECIMEN:  No Specimen  COUNTS:  YES  TOURNIQUET:  * Missing tourniquet times found for documented tourniquets in log: [id number] *  DICTATION: .Other Dictation: Dictation Number [id number]  PLAN OF CARE: Discharge to home after PACU  PATIENT DISPOSITION:  PACU - hemodynamically stable

## 2020-04-02 NOTE — Anesthesia Postprocedure Evaluation (Signed)
Anesthesia Post Note  Patient: Kimberly Roach  Procedure(s) Performed: RIGHT KNEE ARTHROSCOPY, DEBRIDEMENT (Right Knee) KNEE ARTHROSCOPY WITH LATERAL Partial MENISECTOMY (Right Knee)     Patient location during evaluation: PACU Anesthesia Type: General Level of consciousness: awake Pain management: pain level controlled Vital Signs Assessment: post-procedure vital signs reviewed and stable Respiratory status: spontaneous breathing Cardiovascular status: stable Postop Assessment: no apparent nausea or vomiting Anesthetic complications: no   No complications documented.  Last Vitals:  Vitals:   04/02/20 0945 04/02/20 1000  BP: (!) 95/49 104/65  Pulse: 76 71  Resp: 13 13  Temp:    SpO2: 100% 100%    Last Pain:  Vitals:   04/02/20 1000  TempSrc:   PainSc: 0-No pain        RLE Motor Response:  (UTA at this time, pt asleep) (04/02/20 1000)        Ivorie Uplinger

## 2020-04-02 NOTE — Transfer of Care (Signed)
Immediate Anesthesia Transfer of Care Note  Patient: Kimberly Roach  Procedure(s) Performed: RIGHT KNEE ARTHROSCOPY, DEBRIDEMENT (Right Knee) KNEE ARTHROSCOPY WITH LATERAL Partial MENISECTOMY (Right Knee)  Patient Location: PACU  Anesthesia Type:General  Level of Consciousness: awake, alert  and oriented  Airway & Oxygen Therapy: Patient Spontanous Breathing and Patient connected to face mask oxygen  Post-op Assessment: Report given to RN and Post -op Vital signs reviewed and stable  Post vital signs: Reviewed and stable  Last Vitals:  Vitals Value Taken Time  BP 94/48 04/02/20 0936  Temp    Pulse 76 04/02/20 0937  Resp 14 04/02/20 0937  SpO2 100 % 04/02/20 0937  Vitals shown include unvalidated device data.  Last Pain:  Vitals:   04/02/20 0658  TempSrc: Oral  PainSc: 3       Patients Stated Pain Goal: 4 (04/02/20 3818)  Complications: No complications documented.

## 2020-04-02 NOTE — Discharge Instructions (Signed)
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
## 2020-04-02 NOTE — Anesthesia Procedure Notes (Signed)
Procedure Name: LMA Insertion Performed by: Sariyah Corcino M, CRNA Pre-anesthesia Checklist: Patient identified, Emergency Drugs available, Suction available and Patient being monitored Patient Re-evaluated:Patient Re-evaluated prior to induction Oxygen Delivery Method: Circle system utilized Preoxygenation: Pre-oxygenation with 100% oxygen Induction Type: IV induction Ventilation: Mask ventilation without difficulty LMA: LMA inserted LMA Size: 4.0 Number of attempts: 1 Airway Equipment and Method: Bite block Placement Confirmation: positive ETCO2 Tube secured with: Tape Dental Injury: Teeth and Oropharynx as per pre-operative assessment        

## 2020-04-03 ENCOUNTER — Encounter (HOSPITAL_BASED_OUTPATIENT_CLINIC_OR_DEPARTMENT_OTHER): Payer: Self-pay | Admitting: Orthopedic Surgery

## 2020-04-03 ENCOUNTER — Telehealth: Payer: Self-pay

## 2020-04-03 NOTE — Op Note (Signed)
Kimberly Roach, WITTS MEDICAL RECORD NO: [id number] ACCOUNT NO: [id number] DATE OF BIRTH: 30-Oct-2000 FACILITY: [facility] LOCATION: [facility] PHYSICIAN: Graylin Shiver. August Saucer, MD  Operative Report   DATE OF PROCEDURE: 04/02/2020  PREOPERATIVE DIAGNOSIS:  Right knee lateral meniscal tear with symptomatic mechanical symptoms.  POSTOPERATIVE DIAGNOSIS:  Right knee lateral meniscal tear with symptomatic mechanical symptoms.  PROCEDURE:  Right knee arthroscopy with partial lateral meniscectomy.  SURGEON:  Cammy Copa, MD  ASSISTANT:  Karenann Cai, PA  INDICATIONS:  The patient is a 20 year old who injured her right knee about a year ago.  MRI scan from September demonstrated lateral meniscal tear with both vertical and horizontal components along with a radial component.  She has had persistent  symptoms and presents now for operative management after explanation of risks and benefits.  OPERATIVE FINDINGS: 1.  Examination under anesthesia:  The patient did have some clicking with range of motion of the right knee with slight valgus force applied.  Collateral and cruciate ligaments were stable.  Range of motion was full.  2.  Diagnostic and operative arthroscopy. a.  Intact patellofemoral compartment. b.  No loose bodies in the medial or lateral gutter. c.  Intact ACL and PCL. d.  Intact medial compartment articular surface and meniscus. e.  Significant tear of the lateral meniscus with both radial and horizontal and vertical tear components.  This involved about 90% of the anterior, posterior width of the meniscus.  There were also grade I changes on the lateral femoral condyle and  lateral tibial plateau about 50% surface area of each.  DESCRIPTION OF PROCEDURE:  The patient was brought to the operating room where general anesthetic was induced.  Preoperative antibiotics were administered.  Timeout was called.  Right leg was prescrubbed with alcohol and Betadine, allowed to air dry,   prepped with DuraPrep solution, and draped in a sterile manner.  Timeout was called.  Anterior inferolateral and anterior inferomedial portals were first anesthetized with 5 mL of Marcaine with epinephrine.  The portals were then created.  Arthroscopy  was performed.  Some debridement of the anterior fat pad was performed.  Patellofemoral compartment intact.  Medial compartment intact.  ACL, PCL intact.  Lateral compartment had early grade I changes over 50% of the weightbearing surface area of both  the lateral femoral condyle and lateral tibial plateau.  No loose chondral flaps were present.  The patient also had a complex lateral meniscal tear, which was not repairable.  This had both a significant radial component along with horizontal cleavage  component.  This was right at the popliteal region.  Meniscal tear was debrided trying to maintain as much of the peripheral rim as possible.  All in all, about 90% of that meniscal tear required debridement and was unrepairable.  Following this,  Arthrocare wand was used to cauterize any bleeding points.  Thorough irrigation was then performed.  Collene Mares was also utilized.  Instruments were removed.  Portals were closed using 3-0 nylon.  A solution of Marcaine, morphine, clonidine injected into the  knee for postop pain relief.  The patient tolerated the procedure well without immediate complications.  Bulky wrap applied.  Luke's assistance was required for limb positioning, opening, and closing.  His assistance was a medical necessity.   [facility] D: 04/02/2020 9:41:45 am T: 04/02/2020 1:02:00 pm  JOB: [id number]/ [id number]

## 2020-04-03 NOTE — Telephone Encounter (Signed)
Kimberly Roach's mom Synetta Fail would like clarification on knee exercises.  Kimberly Roach had right knee surgery on 04/02/2020. CB# [phone].  Please advise.  Thank you.

## 2020-04-04 NOTE — Telephone Encounter (Signed)
SLR and knee ROM 30 rep 3x/day   Will call to advise

## 2020-04-04 NOTE — Telephone Encounter (Signed)
IC advised.  

## 2020-04-09 ENCOUNTER — Ambulatory Visit (INDEPENDENT_AMBULATORY_CARE_PROVIDER_SITE_OTHER): Payer: Self-pay | Admitting: Orthopedic Surgery

## 2020-04-09 ENCOUNTER — Other Ambulatory Visit: Payer: Self-pay

## 2020-04-09 ENCOUNTER — Telehealth: Payer: Self-pay

## 2020-04-09 DIAGNOSIS — S83271A Complex tear of lateral meniscus, current injury, right knee, initial encounter: Secondary | ICD-10-CM

## 2020-04-09 NOTE — Telephone Encounter (Signed)
Patient called stating that she needs a note stating that she is not able to drive.  CB# [phone].  Please advise.  Thank you.

## 2020-04-09 NOTE — Telephone Encounter (Signed)
Note done. Patient will access through [facility]

## 2020-04-10 ENCOUNTER — Encounter: Payer: Self-pay | Admitting: Orthopedic Surgery

## 2020-04-10 NOTE — Progress Notes (Signed)
Post-Op Visit Note   Patient: Kimberly Roach           Date of Birth: [birth year]           MRN: [id number] Visit Date: 04/09/2020 PCP: Mickie Bail, MD   Assessment & Plan:  Chief Complaint:  Chief Complaint  Patient presents with  . Right Knee - Routine Post Op    04/02/20 Right knee scope with deb.    Visit Diagnoses:  1. Complex tear of lateral meniscus of right knee as current injury, initial encounter     Plan: Patient is a 20 year old female who presents s/p right knee arthroscopy with meniscal debridement on 04/02/2020.  She complains of moderate pain.  She is taking tramadol as needed for pain control.  Also taking aspirin for DVT prophylaxis.  Sutures were removed today and replaced with Steri-Strips.  Incisions are healing well without any evidence of infection or dehiscence.  She has been attempting weightbearing but can only touchdown weight-bear for now due to pain.  Able to perform multiple straight leg raises.  5 degrees of extension and 80 degrees of flexion.  No calf tenderness.  Negative Homans' sign.  Small effusion present.  Plan to continue progressing weightbearing as she can tolerate with pain and working on knee range of motion and straight leg raises.  Due to the extensive meniscal tearing and debridement that was required at time of surgery, plan for eventual referral to outside facility for consideration of right knee meniscal transplant about 3 months out from procedure.  Discussed this with patient and her mother.  They understand and agree with the plan eventually.  Follow-up in 4 weeks.  Follow-Up Instructions: Return in about 4 weeks (around 05/07/2020).   Orders:  No orders of the defined types were placed in this encounter.  No orders of the defined types were placed in this encounter.   Imaging: No results found.  PMFS History: Patient Active Problem List   Diagnosis Date Noted  . Right knee pain 09/14/2019  . Marijuana abuse 12/31/2017   . 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 MDD (major depressive disorder), recurrent episode, moderate (HCC) 12/08/2017  . Other constipation 03/28/2016  . Vaginal discharge 03/28/2016  . Bacterial vaginosis 07/23/2014  . Recurrent UTI 07/23/2014   Past Medical History:  Diagnosis Date  . Anxiety   . Depression   . Wears contact lenses     Family History  Problem Relation Age of Onset  . Healthy Mother   . Anxiety disorder Father   . Depression Father   . Healthy Father     Past Surgical History:  Procedure Laterality Date  . KNEE ARTHROSCOPY Right 04/02/2020   Procedure: RIGHT KNEE ARTHROSCOPY, DEBRIDEMENT;  Surgeon: Cammy Copa, MD;  Location: [facility];  Service: Orthopedics;  Laterality: Right;  . KNEE ARTHROSCOPY WITH LATERAL MENISECTOMY Right 04/02/2020   Procedure: KNEE ARTHROSCOPY WITH LATERAL Partial MENISECTOMY;  Surgeon: Cammy Copa, MD;  Location: [facility];  Service: Orthopedics;  Laterality: Right;  . TONSILLECTOMY    . TONSILLECTOMY AND ADENOIDECTOMY N/A 11/22/2014   Procedure: TONSILLECTOMY AND ADENOIDECTOMY;  Surgeon: Bud Face, MD;  Location: [facility];  Service: ENT;  Laterality: N/A;  ADENOIDS CAUTERIZED NO TISSUE SENT FOR PATHOLOGY   Social History   Occupational History    Comment: full time student  Tobacco Use  . Smoking status: Never Smoker  . Smokeless tobacco: Never Used  Vaping Use  .  Vaping Use: Never used  Substance and Sexual Activity  . Alcohol use: Not Currently    Alcohol/week: 1.0 standard drink    Types: 1 Glasses of wine per week  . Drug use: Yes    Frequency: 1.0 times per week    Types: Marijuana  . Sexual activity: Yes    Birth control/protection: None

## 2020-04-11 DIAGNOSIS — S83271A Complex tear of lateral meniscus, current injury, right knee, initial encounter: Secondary | ICD-10-CM

## 2020-04-25 IMAGING — DX DG KNEE COMPLETE 4+V*R*
5 series · 5 of 5 positions shown · non-contrast
Comparison: None.

CLINICAL DATA: 18-year-old female with trauma to the right knee.

EXAM:
RIGHT KNEE - COMPLETE 4+ VIEW

[knee ap]
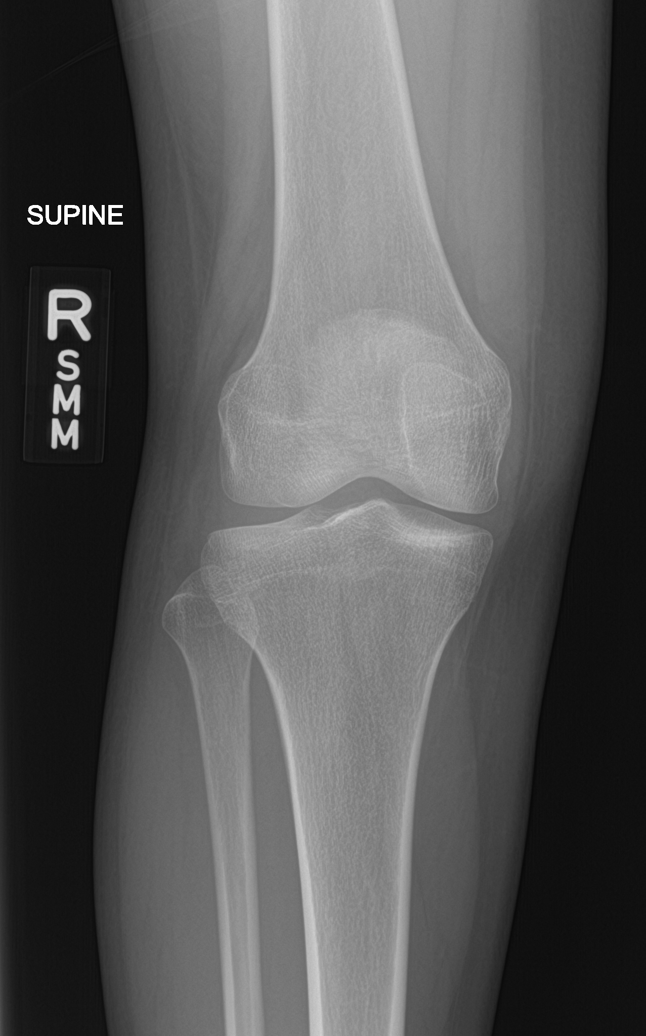

[knee obl (1 of 2)]
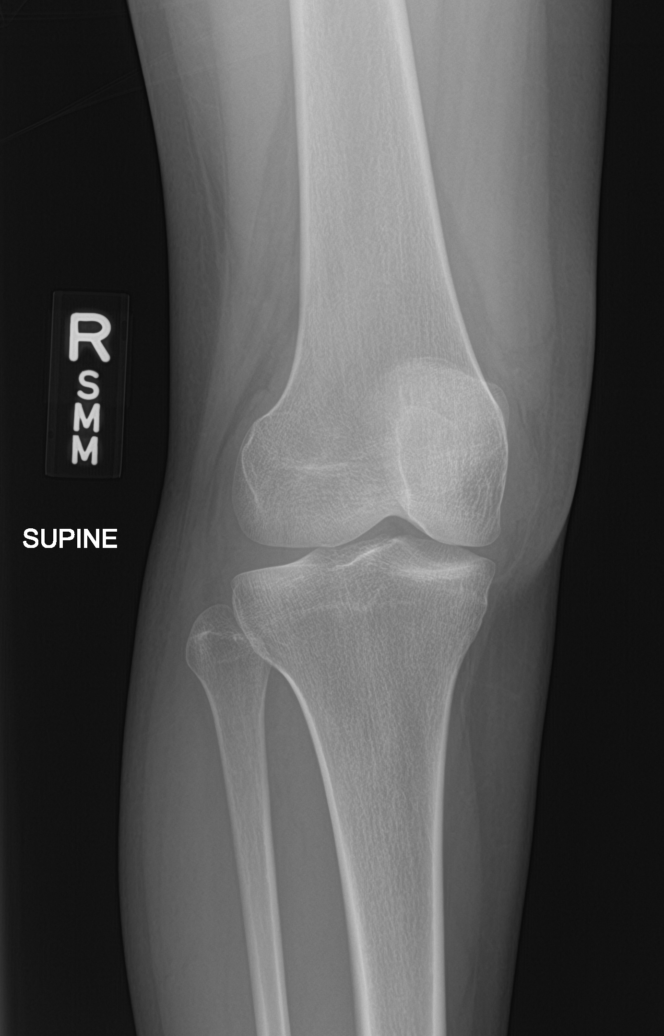

[knee obl (2 of 2)]
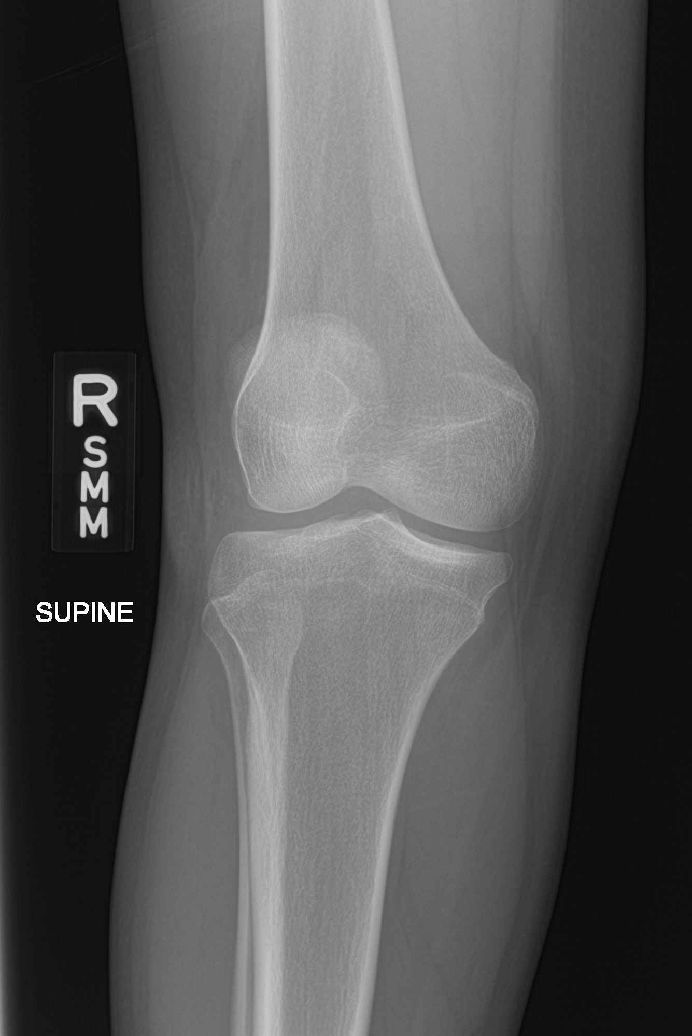

[knee lat (1 of 2)]
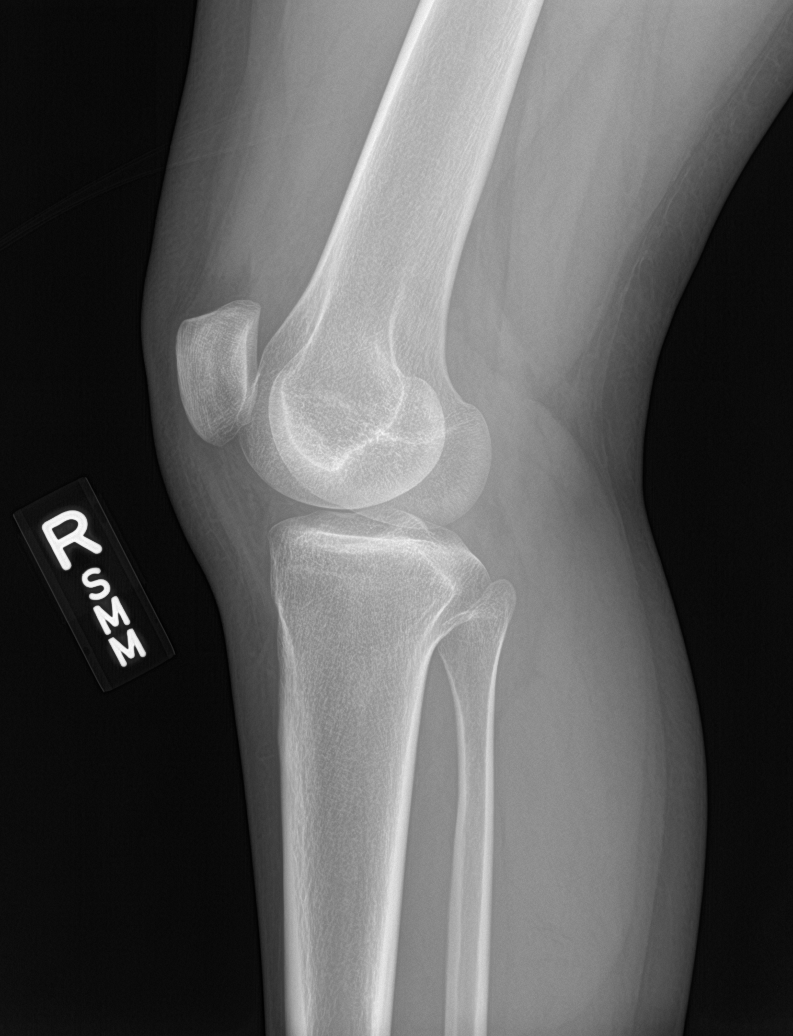

[knee lat (2 of 2)]
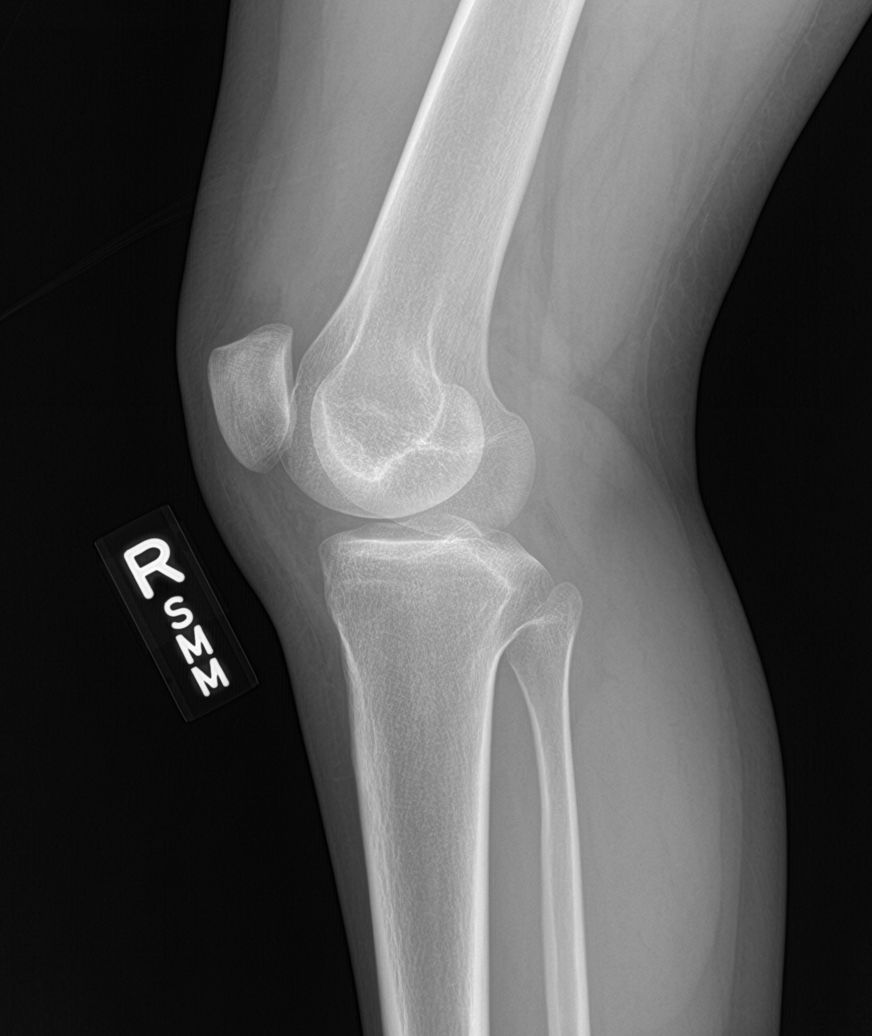

[5 of 5 positions shown; findings below may reference images not displayed]

FINDINGS: There is no acute fracture or dislocation. The bones are well
mineralized. No arthritic changes. There is a small suprapatellar
effusion. The soft tissues are unremarkable.
IMPRESSION: No acute osseous pathology.

## 2020-05-07 ENCOUNTER — Ambulatory Visit (INDEPENDENT_AMBULATORY_CARE_PROVIDER_SITE_OTHER): Payer: BC Managed Care – PPO | Admitting: Orthopedic Surgery

## 2020-05-07 ENCOUNTER — Other Ambulatory Visit: Payer: Self-pay

## 2020-05-07 DIAGNOSIS — S83271A Complex tear of lateral meniscus, current injury, right knee, initial encounter: Secondary | ICD-10-CM

## 2020-05-08 ENCOUNTER — Encounter (HOSPITAL_COMMUNITY): Payer: Self-pay | Admitting: Emergency Medicine

## 2020-05-08 ENCOUNTER — Other Ambulatory Visit: Payer: Self-pay

## 2020-05-08 ENCOUNTER — Ambulatory Visit (HOSPITAL_COMMUNITY)
Admission: EM | Admit: 2020-05-08 | Discharge: 2020-05-08 | Disposition: A | Discharge status: Home / Self Care | Payer: BC Managed Care – PPO | Attending: Emergency Medicine | Admitting: Emergency Medicine

## 2020-05-08 DIAGNOSIS — J069 Acute upper respiratory infection, unspecified: Secondary | ICD-10-CM | POA: Diagnosis not present

## 2020-05-08 DIAGNOSIS — B3731 Acute candidiasis of vulva and vagina: Secondary | ICD-10-CM

## 2020-05-08 DIAGNOSIS — B373 Candidiasis of vulva and vagina: Secondary | ICD-10-CM | POA: Diagnosis not present

## 2020-05-08 DIAGNOSIS — Z20822 Contact with and (suspected) exposure to covid-19: Secondary | ICD-10-CM | POA: Diagnosis not present

## 2020-05-08 DIAGNOSIS — N898 Other specified noninflammatory disorders of vagina: Secondary | ICD-10-CM | POA: Diagnosis present

## 2020-05-08 LAB — SARS CORONAVIRUS 2 (TAT 6-24 HRS): SARS Coronavirus 2: NEGATIVE

## 2020-05-08 MED ORDER — OSELTAMIVIR PHOSPHATE 75 MG PO CAPS: 75.0000 mg | ORAL_CAPSULE | Freq: Two times a day (BID) | ORAL | 0 refills | Status: DC

## 2020-05-08 MED ORDER — MICONAZOLE NITRATE 2 % EX CREA: 1.0000 "application " | TOPICAL_CREAM | Freq: Two times a day (BID) | CUTANEOUS | 0 refills | Status: DC

## 2020-05-08 MED ORDER — FLUCONAZOLE 150 MG PO TABS: 150.0000 mg | ORAL_TABLET | Freq: Every day | ORAL | 0 refills | Status: AC

## 2020-05-08 NOTE — ED Provider Notes (Signed)
[facility]    CSN: [id number] Arrival date & time: 05/08/20  7654      History   Chief Complaint Chief Complaint  Patient presents with  . Vaginal Discharge  . URI    HPI Kimberly Roach is a 20 y.o. female.   Patient presents productive cough, chills, headache, fever for 2 days. Shortness of breath present with exertion. Denies sore throat, abdominal pain, nausea, vomiting, diarrhea. Taking otc cold and flu medication.   C/o thick Arlone Lenhardt vaginal discharge starting 3 days ago with vaginal itching and dryness. Denies frequency, dysuria, hematuria, urgency, flank pain, lesions or rash. New partner, no condom use.   Past Medical History:  Diagnosis Date  . Anxiety   . Depression   . Wears contact lenses     Patient Active Problem List   Diagnosis Date Noted  . Complex tear of lateral meniscus of right knee as current injury   . Right knee pain 09/14/2019  . Marijuana abuse 12/31/2017  . 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 MDD (major depressive disorder), recurrent episode, moderate (HCC) 12/08/2017  . Other constipation 03/28/2016  . Vaginal discharge 03/28/2016  . Bacterial vaginosis 07/23/2014  . Recurrent UTI 07/23/2014    Past Surgical History:  Procedure Laterality Date  . KNEE ARTHROSCOPY Right 04/02/2020   Procedure: RIGHT KNEE ARTHROSCOPY, DEBRIDEMENT;  Surgeon: Cammy Copa, MD;  Location: [facility];  Service: Orthopedics;  Laterality: Right;  . KNEE ARTHROSCOPY WITH LATERAL MENISECTOMY Right 04/02/2020   Procedure: KNEE ARTHROSCOPY WITH LATERAL Partial MENISECTOMY;  Surgeon: Cammy Copa, MD;  Location: [facility];  Service: Orthopedics;  Laterality: Right;  . TONSILLECTOMY    . TONSILLECTOMY AND ADENOIDECTOMY N/A 11/22/2014   Procedure: TONSILLECTOMY AND ADENOIDECTOMY;  Surgeon: Bud Face, MD;  Location: [facility];  Service: ENT;  Laterality: N/A;  ADENOIDS CAUTERIZED NO TISSUE  SENT FOR PATHOLOGY    OB History    Gravida  1   Para      Term      Preterm      AB  1   Living        SAB  1   IAB      Ectopic      Multiple      Live Births               Home Medications    Prior to Admission medications   Medication Sig Start Date End Date Taking? Authorizing Provider  fluconazole (DIFLUCAN) 150 MG tablet Take 1 tablet (150 mg total) by mouth daily for 2 days. 05/08/20 05/10/20 Yes Angela Vazguez R, NP  miconazole (MICOTIN) 2 % cream Apply 1 application topically 2 (two) times daily. 05/08/20  Yes Darneisha Windhorst, Elita Boone, NP  oseltamivir (TAMIFLU) 75 MG capsule Take 1 capsule (75 mg total) by mouth every 12 (twelve) hours. 05/08/20  Yes Kemari Narez R, NP  ketorolac (TORADOL) 10 MG tablet Take 1 tablet (10 mg total) by mouth every 8 (eight) hours as needed. 04/02/20   Magnant, Charles L, PA-C  methocarbamol (ROBAXIN) 500 MG tablet Take 1 tablet (500 mg total) by mouth every 8 (eight) hours as needed. 04/02/20   Magnant, Joycie Peek, PA-C  oxyCODONE-acetaminophen (PERCOCET) 5-325 MG tablet Take 1 tablet by mouth every 4 (four) hours as needed for severe pain. 04/02/20 04/02/21  Magnant, Charles L, PA-C  albuterol (PROVENTIL) (2.5 MG/3ML) 0.083% nebulizer solution Take 3 mLs (2.5 mg total) by nebulization  every 6 (six) hours as needed for wheezing or shortness of breath. 03/12/18 11/06/18  Linus Mako B, NP  ipratropium (ATROVENT) 0.06 % nasal spray Place 2 sprays into both nostrils 3 (three) times daily as needed for rhinitis. 11/06/18 11/25/18  Georgetta Haber, NP    Family History Family History  Problem Relation Age of Onset  . Healthy Mother   . Anxiety disorder Father   . Depression Father   . Healthy Father     Social History Social History   Tobacco Use  . Smoking status: Never Smoker  . Smokeless tobacco: Never Used  Vaping Use  . Vaping Use: Never used  Substance Use Topics  . Alcohol use: Not Currently    Alcohol/week: 1.0  standard drink    Types: 1 Glasses of wine per week  . Drug use: Yes    Frequency: 1.0 times per week    Types: Marijuana     Allergies   Patient has no known allergies.   Review of Systems Review of Systems   Defer to HPI    Physical Exam Triage Vital Signs ED Triage Vitals  Enc Vitals Group     BP 05/08/20 0958 124/74     Pulse Rate 05/08/20 0958 (!) 102     Resp 05/08/20 0958 18     Temp 05/08/20 0958 (S) 100.2 F (37.9 C)     Temp Source 05/08/20 0958 Oral     SpO2 05/08/20 0958 96 %     Weight --      Height --      Head Circumference --      Peak Flow --      Pain Score 05/08/20 1000 7     Pain Loc --      Pain Edu? --      Excl. in GC? --    No data found.  Updated Vital Signs BP 124/74 (BP Location: Right Arm)   Pulse (!) 102   Temp (S) 100.2 F (37.9 C) (Oral)   Resp 18   SpO2 96%   Visual Acuity Right Eye Distance:   Left Eye Distance:   Bilateral Distance:    Right Eye Near:   Left Eye Near:    Bilateral Near:     Physical Exam Constitutional:      Appearance: Normal appearance. She is normal weight.  HENT:     Head: Normocephalic.     Right Ear: Tympanic membrane, ear canal and external ear normal.     Left Ear: Tympanic membrane, ear canal and external ear normal.     Nose: Congestion and rhinorrhea present.     Mouth/Throat:     Mouth: Mucous membranes are moist.     Pharynx: Posterior oropharyngeal erythema present.  Eyes:     Extraocular Movements: Extraocular movements intact.     Conjunctiva/sclera: Conjunctivae normal.     Pupils: Pupils are equal, round, and reactive to light.  Cardiovascular:     Rate and Rhythm: Normal rate and regular rhythm.     Pulses: Normal pulses.     Heart sounds: Normal heart sounds.  Pulmonary:     Effort: Pulmonary effort is normal.     Breath sounds: Normal breath sounds.  Abdominal:     General: Abdomen is flat. Bowel sounds are normal.     Palpations: Abdomen is soft.   Musculoskeletal:        General: Normal range of motion.     Cervical back: Normal  range of motion.  Skin:    General: Skin is warm and dry.  Neurological:     General: No focal deficit present.     Mental Status: She is alert and oriented to person, place, and time. Mental status is at baseline.  Psychiatric:        Mood and Affect: Mood normal.        Behavior: Behavior normal.        Thought Content: Thought content normal.        Judgment: Judgment normal.      UC Treatments / Results  Labs (all labs ordered are listed, but only abnormal results are displayed) Labs Reviewed  SARS CORONAVIRUS 2 (TAT 6-24 HRS)  CERVICOVAGINAL ANCILLARY ONLY    EKG   Radiology No results found.  Procedures Procedures (including critical care time)  Medications Ordered in UC Medications - No data to display  Initial Impression / Assessment and Plan / UC Course  I have reviewed the triage vital signs and the nursing notes.  Pertinent labs & imaging results that were available during my care of the patient were reviewed by me and considered in my medical decision making (see chart for details).  Viral URI with cough Vaginal candida  1. covid- pending 2. tamiflu 75 mg bid for 5 days 3. otc medicine for symptom management 4. Diflucan 150 mg once 5. Miconazole external cream bid prn 6. sti screening -pending, will treat per protocol advised abstinence until labs/ and or treatment complete  Final Clinical Impressions(s) / UC Diagnoses   Final diagnoses:  Viral URI with cough  Vaginal candida     Discharge Instructions     Covid test pending 24 hours Lab test pending 2-3 days, you will be notified if positive for any test for treament  Do not have sex until labs result if positive do not have sex until treatment complete  Take one diflucan pill today, if symptoms present in 3 days you may take the second pill  Can use external cream twice a day as needed for additional  comfort  Can use tamiflu twice daily for the next 5 days   Can use over the counter medications to help with symptoms    ED Prescriptions    Medication Sig Dispense Auth. Provider   oseltamivir (TAMIFLU) 75 MG capsule Take 1 capsule (75 mg total) by mouth every 12 (twelve) hours. 10 capsule Hiawatha Merriott R, NP   fluconazole (DIFLUCAN) 150 MG tablet Take 1 tablet (150 mg total) by mouth daily for 2 days. 2 tablet Latese Dufault R, NP   miconazole (MICOTIN) 2 % cream Apply 1 application topically 2 (two) times daily. 28.35 g Valinda Hoar, NP     PDMP not reviewed this encounter.   Valinda Hoar, NP 05/08/20 1028

## 2020-05-08 NOTE — Discharge Instructions (Addendum)
Covid test pending 24 hours Lab test pending 2-3 days, you will be notified if positive for any test for treament  Do not have sex until labs result if positive do not have sex until treatment complete  Take one diflucan pill today, if symptoms present in 3 days you may take the second pill  Can use external cream twice a day as needed for additional comfort  Can use tamiflu twice daily for the next 5 days   Can use over the counter medications to help with symptoms

## 2020-05-08 NOTE — ED Triage Notes (Signed)
Here for cold sx onset 2 days associated w/headache, cough, chills  Denies f/v/n/d  Taking OTC cough med  Also would like to be screened for STDs since she is in a new relationship w/female partner and does not use condoms.   A&O x4... NAD.Marland Kitchen. ambulatory

## 2020-05-09 ENCOUNTER — Encounter: Payer: Self-pay | Admitting: Orthopedic Surgery

## 2020-05-09 LAB — CERVICOVAGINAL ANCILLARY ONLY
Bacterial Vaginitis (gardnerella): NEGATIVE
Candida Glabrata: NEGATIVE
Candida Vaginitis: POSITIVE — AB
Chlamydia: NEGATIVE
Comment: NEGATIVE
Comment: NEGATIVE
Comment: NEGATIVE
Comment: NEGATIVE
Comment: NEGATIVE
Comment: NORMAL
Neisseria Gonorrhea: NEGATIVE
Trichomonas: NEGATIVE

## 2020-05-09 NOTE — Progress Notes (Signed)
Post-Op Visit Note   Patient: Kimberly Roach           Date of Birth: 10-04-2000           MRN: [id number] Visit Date: 05/07/2020 PCP: Mickie Bail, MD   Assessment & Plan:  Chief Complaint:  Chief Complaint  Patient presents with  . Right Knee - Routine Post Op    DOS 04/02/20,    Visit Diagnoses:  1. Complex tear of lateral meniscus of right knee as current injury, initial encounter     Plan: Patient is a 20 year old female who presents s/p right knee arthroscopy with meniscal debridement performed on 04/02/2020.  She reports that she is doing okay overall and feels she is continually progressing.  She is walking much better and currently full weightbearing.  Only taking occasional Advil for pain control.  She does note some soreness with walking that gets worse as she walks more distance.  She has occasional instability especially with going downstairs.  Denies any locking or mechanical symptoms.  She is okay going up stairs.  On exam she has 3 degrees to 120 degrees.  Incisions have healed well.  Quad is weak but firing overall.  Quad atrophy of the operative leg that is not present on the contralateral side.  Plan to continue school accommodations given her continued difficulty ambulating though she is improving.  Recommended she focus on achieving full extension and optimizing her quadricep strength which should help with her knee instability that she is experiencing.  Plan to follow-up for clinical recheck in 6 weeks.  Consider referral for meniscal transplant at that time.  Follow-Up Instructions: No follow-ups on file.   Orders:  No orders of the defined types were placed in this encounter.  No orders of the defined types were placed in this encounter.   Imaging: No results found.  PMFS History: Patient Active Problem List   Diagnosis Date Noted  . Complex tear of lateral meniscus of right knee as current injury   . Right knee pain 09/14/2019  . Marijuana abuse  12/31/2017  . 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 MDD (major depressive disorder), recurrent episode, moderate (HCC) 12/08/2017  . Other constipation 03/28/2016  . Vaginal discharge 03/28/2016  . Bacterial vaginosis 07/23/2014  . Recurrent UTI 07/23/2014   Past Medical History:  Diagnosis Date  . Anxiety   . Depression   . Wears contact lenses     Family History  Problem Relation Age of Onset  . Healthy Mother   . Anxiety disorder Father   . Depression Father   . Healthy Father     Past Surgical History:  Procedure Laterality Date  . KNEE ARTHROSCOPY Right 04/02/2020   Procedure: RIGHT KNEE ARTHROSCOPY, DEBRIDEMENT;  Surgeon: Cammy Copa, MD;  Location: [facility];  Service: Orthopedics;  Laterality: Right;  . KNEE ARTHROSCOPY WITH LATERAL MENISECTOMY Right 04/02/2020   Procedure: KNEE ARTHROSCOPY WITH LATERAL Partial MENISECTOMY;  Surgeon: Cammy Copa, MD;  Location:  [facility];  Service: Orthopedics;  Laterality: Right;  . TONSILLECTOMY    . TONSILLECTOMY AND ADENOIDECTOMY N/A 11/22/2014   Procedure: TONSILLECTOMY AND ADENOIDECTOMY;  Surgeon: Bud Face, MD;  Location: [facility];  Service: ENT;  Laterality: N/A;  ADENOIDS CAUTERIZED NO TISSUE SENT FOR PATHOLOGY   Social History   Occupational History    Comment: full time student  Tobacco Use  . Smoking status: Never Smoker  . Smokeless tobacco: Never Used  Vaping Use  . Vaping Use: Never used  Substance and Sexual Activity  . Alcohol use: Not Currently    Alcohol/week: 1.0 standard drink    Types: 1 Glasses of wine per week  . Drug use: Yes    Frequency: 1.0 times per week    Types: Marijuana  . Sexual activity: Yes    Birth control/protection: None

## 2020-06-20 ENCOUNTER — Encounter: Payer: BC Managed Care – PPO | Admitting: Orthopedic Surgery

## 2020-07-09 ENCOUNTER — Encounter (HOSPITAL_COMMUNITY): Payer: Self-pay | Admitting: Emergency Medicine

## 2020-07-09 ENCOUNTER — Ambulatory Visit (HOSPITAL_COMMUNITY)
Admission: EM | Admit: 2020-07-09 | Discharge: 2020-07-09 | Disposition: A | Discharge status: Home / Self Care | Payer: BC Managed Care – PPO | Attending: Internal Medicine | Admitting: Internal Medicine

## 2020-07-09 ENCOUNTER — Other Ambulatory Visit: Payer: Self-pay

## 2020-07-09 DIAGNOSIS — R109 Unspecified abdominal pain: Secondary | ICD-10-CM | POA: Insufficient documentation

## 2020-07-09 DIAGNOSIS — Z113 Encounter for screening for infections with a predominantly sexual mode of transmission: Secondary | ICD-10-CM | POA: Diagnosis not present

## 2020-07-09 DIAGNOSIS — N3001 Acute cystitis with hematuria: Secondary | ICD-10-CM | POA: Insufficient documentation

## 2020-07-09 DIAGNOSIS — M549 Dorsalgia, unspecified: Secondary | ICD-10-CM | POA: Diagnosis not present

## 2020-07-09 LAB — POCT URINALYSIS DIPSTICK, ED / UC
Glucose, UA: 100 mg/dL — AB
Leukocytes,Ua: NEGATIVE
Nitrite: POSITIVE — AB
Protein, ur: >=300 mg/dL — AB
Specific Gravity, Urine: 1.02 (ref 1.005–1.030)
Urobilinogen, UA: 1 mg/dL (ref 0.0–1.0)
pH: 7 (ref 5.0–8.0)

## 2020-07-09 LAB — POC URINE PREG, ED: Preg Test, Ur: NEGATIVE

## 2020-07-09 MED ORDER — NITROFURANTOIN MONOHYD MACRO 100 MG PO CAPS: 100.0000 mg | ORAL_CAPSULE | Freq: Two times a day (BID) | ORAL | 0 refills | Status: DC

## 2020-07-09 NOTE — Discharge Instructions (Signed)
Take antibiotic twice a day for 5 days, urine has been sent to lab to let bacteria grow, you will be called if there is a need to change antibiotic  Sti screen pending 2-3 days, you will be called if positive, please do not have sex until labs result, if positive do not have sex until treatment complete

## 2020-07-09 NOTE — ED Provider Notes (Signed)
[facility]    CSN: [id number] Arrival date & time: 07/09/20  0806      History   Chief Complaint Chief Complaint  Patient presents with   Urinary Tract Infection    HPI Kimberly Roach is a 20 y.o. female.   Patient presents with hematuria, left sided abdominal pain, left sided back pain, urinary frequency beginning 4 days ago. Describes pain as soreness. Urine is seen in toilet described as dark red. Started expected menses 3 days ago. Denies discharge, itching, odor, rash, lesion, urgency. Sexually active, 1 partner, no condom use. Took three old antibiotics.    Past Medical History:  Diagnosis Date   Anxiety    Depression    Wears contact lenses     Patient Active Problem List   Diagnosis Date Noted   Complex tear of lateral meniscus of right knee as current injury    Right knee pain 09/14/2019   Marijuana abuse 12/31/2017   Other specified anxiety disorders 12/24/2017   MDD (major depressive disorder), recurrent episode, moderate (HCC) 12/08/2017   Other constipation 03/28/2016   Vaginal discharge 03/28/2016   Bacterial vaginosis 07/23/2014   Recurrent UTI 07/23/2014    Past Surgical History:  Procedure Laterality Date   KNEE ARTHROSCOPY Right 04/02/2020   Procedure: RIGHT KNEE ARTHROSCOPY, DEBRIDEMENT;  Surgeon: Cammy Copa, MD;  Location: [facility];  Service: Orthopedics;  Laterality: Right;   KNEE ARTHROSCOPY WITH LATERAL MENISECTOMY Right 04/02/2020   Procedure: KNEE ARTHROSCOPY WITH LATERAL Partial MENISECTOMY;  Surgeon: Cammy Copa, MD;  Location:  [facility];  Service: Orthopedics;  Laterality: Right;   TONSILLECTOMY     TONSILLECTOMY AND ADENOIDECTOMY N/A 11/22/2014   Procedure: TONSILLECTOMY AND ADENOIDECTOMY;  Surgeon: Bud Face, MD;  Location: [facility];  Service: ENT;  Laterality: N/A;  ADENOIDS CAUTERIZED NO TISSUE SENT FOR PATHOLOGY    OB History     Gravida  1   Para       Term      Preterm      AB  1   Living         SAB  1   IAB      Ectopic      Multiple      Live Births               Home Medications    Prior to Admission medications   Medication Sig Start Date End Date Taking? Authorizing Provider  nitrofurantoin, macrocrystal-monohydrate, (MACROBID) 100 MG capsule Take 1 capsule (100 mg total) by mouth 2 (two) times daily. 07/09/20  Yes Khizar Fiorella R, NP  ketorolac (TORADOL) 10 MG tablet Take 1 tablet (10 mg total) by mouth every 8 (eight) hours as needed. Patient not taking: Reported on 07/09/2020 04/02/20   Magnant, Joycie Peek, PA-C  methocarbamol (ROBAXIN) 500 MG tablet Take 1 tablet (500 mg total) by mouth every 8 (eight) hours as needed. Patient not taking: Reported on 07/09/2020 04/02/20   Magnant, Joycie Peek, PA-C  miconazole (MICOTIN) 2 % cream Apply 1 application topically 2 (two) times daily. Patient not taking: Reported on 07/09/2020 05/08/20   Valinda Hoar, NP  oseltamivir (TAMIFLU) 75 MG capsule Take 1 capsule (75 mg total) by mouth every 12 (twelve) hours. Patient not taking: Reported on 07/09/2020 05/08/20   Valinda Hoar, NP  oxyCODONE-acetaminophen (PERCOCET) 5-325 MG tablet Take 1 tablet by mouth every 4 (four) hours as needed for severe pain. Patient not taking:  Reported on 07/09/2020 04/02/20 04/02/21  Magnant, Joycie Peek, PA-C  albuterol (PROVENTIL) (2.5 MG/3ML) 0.083% nebulizer solution Take 3 mLs (2.5 mg total) by nebulization every 6 (six) hours as needed for wheezing or shortness of breath. 03/12/18 11/06/18  Linus Mako B, NP  ipratropium (ATROVENT) 0.06 % nasal spray Place 2 sprays into both nostrils 3 (three) times daily as needed for rhinitis. 11/06/18 11/25/18  Georgetta Haber, NP    Family History Family History  Problem Relation Age of Onset   Healthy Mother    Anxiety disorder Father    Depression Father    Healthy Father     Social History Social History   Tobacco Use   Smoking status:  Never   Smokeless tobacco: Never  Vaping Use   Vaping Use: Every day  Substance Use Topics   Alcohol use: Yes    Alcohol/week: 1.0 standard drink    Types: 1 Glasses of wine per week   Drug use: Yes    Frequency: 1.0 times per week    Types: Marijuana     Allergies   Patient has no known allergies.   Review of Systems Review of Systems  Constitutional: Negative.   Respiratory: Negative.    Cardiovascular: Negative.   Gastrointestinal:  Positive for abdominal pain. Negative for abdominal distention, anal bleeding, blood in stool, constipation, diarrhea, nausea, rectal pain and vomiting.  Genitourinary:  Positive for flank pain, frequency and hematuria. Negative for decreased urine volume, difficulty urinating, dyspareunia, dysuria, enuresis, genital sores, menstrual problem, pelvic pain, urgency, vaginal bleeding, vaginal discharge and vaginal pain.    Physical Exam Triage Vital Signs ED Triage Vitals  Enc Vitals Group     BP 07/09/20 0855 110/78     Pulse Rate 07/09/20 0855 66     Resp 07/09/20 0855 18     Temp 07/09/20 0855 98.1 F (36.7 C)     Temp Source 07/09/20 0855 Oral     SpO2 07/09/20 0855 98 %     Weight --      Height --      Head Circumference --      Peak Flow --      Pain Score 07/09/20 0852 6     Pain Loc --      Pain Edu? --      Excl. in GC? --    No data found.  Updated Vital Signs BP 110/78 (BP Location: Left Arm)   Pulse 66   Temp 98.1 F (36.7 C) (Oral)   Resp 18   LMP 07/07/2020   SpO2 98%   Visual Acuity Right Eye Distance:   Left Eye Distance:   Bilateral Distance:    Right Eye Near:   Left Eye Near:    Bilateral Near:     Physical Exam Constitutional:      Appearance: Normal appearance. She is normal weight.  HENT:     Head: Normocephalic.  Eyes:     Extraocular Movements: Extraocular movements intact.  Pulmonary:     Effort: Pulmonary effort is normal.  Abdominal:     General: Abdomen is flat. Bowel sounds are  normal.     Palpations: Abdomen is soft.     Tenderness: There is abdominal tenderness in the left upper quadrant and left lower quadrant. There is left CVA tenderness. There is no right CVA tenderness, guarding or rebound.     Hernia: No hernia is present.  Musculoskeletal:        General: Normal  range of motion.     Cervical back: Normal range of motion.  Skin:    General: Skin is warm and dry.  Neurological:     General: No focal deficit present.     Mental Status: She is alert and oriented to person, place, and time. Mental status is at baseline.  Psychiatric:        Mood and Affect: Mood normal.        Behavior: Behavior normal.        Thought Content: Thought content normal.        Judgment: Judgment normal.     UC Treatments / Results  Labs (all labs ordered are listed, but only abnormal results are displayed) Labs Reviewed  POCT URINALYSIS DIPSTICK, ED / UC - Abnormal; Notable for the following components:      Result Value   Glucose, UA 100 (*)    Bilirubin Urine SMALL (*)    Ketones, ur TRACE (*)    Hgb urine dipstick LARGE (*)    Protein, ur >=300 (*)    Nitrite POSITIVE (*)    All other components within normal limits  URINE CULTURE  POC URINE PREG, ED  CERVICOVAGINAL ANCILLARY ONLY    EKG   Radiology No results found.  Procedures Procedures (including critical care time)  Medications Ordered in UC Medications - No data to display  Initial Impression / Assessment and Plan / UC Course  I have reviewed the triage vital signs and the nursing notes.  Pertinent labs & imaging results that were available during my care of the patient were reviewed by me and considered in my medical decision making (see chart for details).  Clinical Course as of 07/09/20 0944  Mon Jul 09, 2020  0909 Protein(!): >=300 [AW]  0910 Ketones, ur(!): TRACE [AW]    Clinical Course User Index [AW] Valinda Hoar, NP    Acute cystitis with hematuria  Urinalysis- defer  to results, sent for culture Macrobid 100 mg for 5 days Urine pregnancy negative Sti screen pending 2-3 days, will treat per protocol, advised abstinence until labs results and/or treatment complete  Final Clinical Impressions(s) / UC Diagnoses   Final diagnoses:  Acute cystitis with hematuria     Discharge Instructions      Take antibiotic twice a day for 5 days, urine has been sent to lab to let bacteria grow, you will be called if there is a need to change antibiotic  Sti screen pending 2-3 days, you will be called if positive, please do not have sex until labs result, if positive do not have sex until treatment complete     ED Prescriptions     Medication Sig Dispense Auth. Provider   nitrofurantoin, macrocrystal-monohydrate, (MACROBID) 100 MG capsule Take 1 capsule (100 mg total) by mouth 2 (two) times daily. 10 capsule Valinda Hoar, NP      PDMP not reviewed this encounter.   Valinda Hoar, [place] 07/09/20 [phone]

## 2020-07-09 NOTE — ED Triage Notes (Signed)
Blood in urine.  Slight abdominal pain, lower abdomen.  No burning with urination.  Patient states blood in urine noticed prior to period starting.  Denies back pain

## 2020-07-10 LAB — CERVICOVAGINAL ANCILLARY ONLY
Bacterial Vaginitis (gardnerella): NEGATIVE
Candida Glabrata: NEGATIVE
Candida Vaginitis: POSITIVE — AB
Chlamydia: NEGATIVE
Comment: NEGATIVE
Comment: NEGATIVE
Comment: NEGATIVE
Comment: NEGATIVE
Comment: NEGATIVE
Comment: NORMAL
Neisseria Gonorrhea: NEGATIVE
Trichomonas: NEGATIVE

## 2020-07-10 LAB — URINE CULTURE: Culture: 20000 — AB

## 2020-07-13 ENCOUNTER — Telehealth (HOSPITAL_COMMUNITY): Payer: Self-pay | Admitting: Emergency Medicine

## 2020-07-13 MED ORDER — FLUCONAZOLE 150 MG PO TABS: 150.0000 mg | ORAL_TABLET | Freq: Once | ORAL | 0 refills | Status: AC

## 2020-07-24 NOTE — Progress Notes (Signed)
PCP:  Mickie Bail, MD   Chief Complaint  Patient presents with   Gynecologic Exam   Contraception    Interested in IUD or depo    HPI:      [name] is a 20 y.o. G1P0010 who LMP was Patient's last menstrual period was 07/07/2020 (exact date)., presents today for her annual examination.  Her menses are monthly, lasting 4-5 days, no BTB, mid dysmen, improved with NSAIDs. Did IUD in past but had it removed last yr because she didn't like how it felt.    Sex activity: single partner, contraception - none. Would like IUD or depo again.  Last Pap: N/A due to age Hx of STDs: none (last done 6/22 at urgent care)  Hx of BV in past, no recent sx.  Pt with 2 wks of gross hematuria. Went to urgent care and culture showed 20,000 COLONIES/mL STREPTOCOCCUS AGALACTIAE, treated with macrobid.  Initially but told to stop abx. Also treated for yeast vag per culture results. Pt still with gross hematuria, sometimes with clots. No vag bleeding. Has LT back pain and LT pelvic discomfort. Also with recent pelvic dyspareunia. No other UTI sx.   There is no FH of breast cancer. There is no FH of ovarian cancer. The patient does not do self-breast exams.  Tobacco use: vapes occas Alcohol use: social drinker No drug use.  Exercise: moderately active  She does get adequate calcium but not Vitamin D in her diet.   Past Medical History:  Diagnosis Date   Anxiety    Depression    Wears contact lenses     Past Surgical History:  Procedure Laterality Date   KNEE ARTHROSCOPY Right 04/02/2020   Procedure: RIGHT KNEE ARTHROSCOPY, DEBRIDEMENT;  Surgeon: Cammy Copa, MD;  Location: [facility];  Service: Orthopedics;  Laterality: Right;   KNEE ARTHROSCOPY WITH LATERAL MENISECTOMY Right 04/02/2020   Procedure: KNEE ARTHROSCOPY WITH LATERAL Partial MENISECTOMY;  Surgeon: Cammy Copa, MD;  Location:  SURGERY CENTER;  Service: Orthopedics;  Laterality:  Right;   TONSILLECTOMY     TONSILLECTOMY AND ADENOIDECTOMY N/A 11/22/2014   Procedure: TONSILLECTOMY AND ADENOIDECTOMY;  Surgeon: Bud Face, MD;  Location: [facility];  Service: ENT;  Laterality: N/A;  ADENOIDS CAUTERIZED NO TISSUE SENT FOR PATHOLOGY    Family History  Problem Relation Age of Onset   Healthy Mother    Anxiety disorder Father    Depression Father    Healthy Father     Social History   Socioeconomic History   Marital status: Single    Spouse name: Not on file   Number of children: 0   Years of education: Not on file   Highest education level: 12th grade  Occupational History    Comment: full time student  Tobacco Use   Smoking status: Never   Smokeless tobacco: Never  Vaping Use   Vaping Use: Every day  Substance and Sexual Activity   Alcohol use: Yes    Alcohol/week: 1.0 standard drink    Types: 1 Glasses of wine per week   Drug use: Yes    Frequency: 1.0 times per week    Types: Marijuana   Sexual activity: Yes    Birth control/protection: None  Other Topics Concern   Not on file  Social History Narrative   Ex boyfriend   Social Determinants of Health   Financial Resource Strain: Not on file  Food Insecurity: Not on file  Transportation Needs:  Not on file  Physical Activity: Not on file  Stress: Not on file  Social Connections: Not on file  Intimate Partner Violence: Not on file     Current Outpatient Medications:    medroxyPROGESTERone Acetate 150 MG/ML SUSY, Inject 1 mL (150 mg total) into the muscle once for 1 dose., Disp: 1 mL, Rfl: 3    ROS:  Review of Systems  Constitutional:  Positive for fatigue. Negative for fever and unexpected weight change.  Respiratory:  Negative for cough, shortness of breath and wheezing.   Cardiovascular:  Negative for chest pain, palpitations and leg swelling.  Gastrointestinal:  Positive for nausea. Negative for blood in stool, constipation, diarrhea and vomiting.  Endocrine: Negative  for cold intolerance, heat intolerance and polyuria.  Genitourinary:  Positive for dyspareunia and hematuria. Negative for dysuria, flank pain, frequency, genital sores, menstrual problem, pelvic pain, urgency, vaginal bleeding, vaginal discharge and vaginal pain.  Musculoskeletal:  Positive for back pain. Negative for joint swelling and myalgias.  Skin:  Negative for rash.  Neurological:  Negative for dizziness, syncope, light-headedness, numbness and headaches.  Hematological:  Negative for adenopathy.  Psychiatric/Behavioral:  Negative for agitation, confusion, sleep disturbance and suicidal ideas. The patient is not nervous/anxious.  BREAST: No symptoms   Objective: BP 118/60   Ht 5\' 1"  (1.549 m)   Wt 119 lb (54 kg)   LMP 07/07/2020 (Exact Date)   BMI 22.48 kg/m    Physical Exam Constitutional:      Appearance: She is well-developed.  Genitourinary:     Vulva normal.     Right Labia: No rash, tenderness or lesions.    Left Labia: No tenderness, lesions or rash.    No vaginal discharge, erythema, tenderness or bleeding.      Right Adnexa: not tender and no mass present.    Left Adnexa: not tender and no mass present.    No cervical friability or polyp.     IUD strings visualized.     Uterus is not enlarged or tender.  Breasts:    Right: No mass, nipple discharge, skin change or tenderness.     Left: No mass, nipple discharge, skin change or tenderness.  Neck:     Thyroid: No thyromegaly.  Cardiovascular:     Rate and Rhythm: Normal rate and regular rhythm.     Heart sounds: Normal heart sounds. No murmur heard. Pulmonary:     Effort: Pulmonary effort is normal.     Breath sounds: Normal breath sounds.  Abdominal:     Palpations: Abdomen is soft.     Tenderness: There is no abdominal tenderness. There is no guarding or rebound.  Musculoskeletal:        General: Normal range of motion.     Cervical back: Normal range of motion.  Lymphadenopathy:     Cervical: No  cervical adenopathy.  Neurological:     General: No focal deficit present.     Mental Status: She is alert and oriented to person, place, and time.     Cranial Nerves: No cranial nerve deficit.  Skin:    General: Skin is warm and dry.  Psychiatric:        Mood and Affect: Mood normal.        Behavior: Behavior normal.        Thought Content: Thought content normal.        Judgment: Judgment normal.  Vitals reviewed.    Results: Results for orders placed or performed in visit on  07/25/20 (from the past 24 hour(s))  POCT Urinalysis Dipstick     Status: Abnormal   Collection Time: 07/25/20  2:34 PM  Result Value Ref Range   Color, UA RED    Clarity, UA cloudy    Glucose, UA     Bilirubin, UA     Ketones, UA     Spec Grav, UA     Blood, UA large    pH, UA     Protein, UA     Urobilinogen, UA     Nitrite, UA     Leukocytes, UA     Appearance     Odor    UNABLE TO READ OTHER RESULTS ACCURATELY DUE TO LARGE AMOUNT OF BLOOD   Assessment/Plan: Encounter for annual routine gynecological examination  Encounter for initial prescription of injectable contraceptive - Plan: medroxyPROGESTERone Acetate 150 MG/ML SUSY; depo start with next menses. Rx eRxd. Condoms for 7 days. RTO for injection. Cont calcium/add Vit D.   Gross hematuria - Plan: Comprehensive metabolic panel, Sickle Cell Scr, Ambulatory referral to Urology, Urine Culture, POCT Urinalysis Dipstick; pos sx, neg GYN exam for vaginal bleeding. Check labs and C&S. Refer to urology ASAP due to chronicity of sx (pt prefers [place] appt since lives there).   Meds ordered this encounter  Medications   medroxyPROGESTERone Acetate 150 MG/ML SUSY    Sig: Inject 1 mL (150 mg total) into the muscle once for 1 dose.    Dispense:  1 mL    Refill:  3    Order Specific Question:   Supervising Provider    Answer:   Nadara Mustard [[id number]]              GYN counsel STD prevention, family planning choices, adequate intake of calcium and  vitamin D, diet and exercise     F/U  Return in about 1 year (around 07/25/2021).  Kashton Mcartor B. Kolston Lacount, PA-C 07/25/2020 2:36 PM

## 2020-07-25 ENCOUNTER — Encounter: Payer: Self-pay | Admitting: Obstetrics and Gynecology

## 2020-07-25 ENCOUNTER — Ambulatory Visit (INDEPENDENT_AMBULATORY_CARE_PROVIDER_SITE_OTHER): Payer: BC Managed Care – PPO | Admitting: Obstetrics and Gynecology

## 2020-07-25 ENCOUNTER — Other Ambulatory Visit: Payer: Self-pay

## 2020-07-25 VITALS — BP 118/60 | Ht 61.0 in | Wt 119.0 lb

## 2020-07-25 DIAGNOSIS — Z01419 Encounter for gynecological examination (general) (routine) without abnormal findings: Secondary | ICD-10-CM

## 2020-07-25 DIAGNOSIS — Z30013 Encounter for initial prescription of injectable contraceptive: Secondary | ICD-10-CM

## 2020-07-25 DIAGNOSIS — R310 Gross hematuria: Secondary | ICD-10-CM | POA: Diagnosis not present

## 2020-07-25 LAB — POCT URINALYSIS DIPSTICK

## 2020-07-25 MED ORDER — MEDROXYPROGESTERONE ACETATE 150 MG/ML IM SUSY
150.0000 mg | PREFILLED_SYRINGE | Freq: Once | INTRAMUSCULAR | 3 refills | Status: DC
Start: 2020-07-25 — End: 2020-08-22

## 2020-07-25 NOTE — Patient Instructions (Signed)
I value your feedback and you entrusting us with your care. If you get a [facility] patient survey, I would appreciate you taking the time to let us know about your experience today. Thank you! ? ? ?

## 2020-07-26 LAB — COMPREHENSIVE METABOLIC PANEL
ALT: 10 IU/L (ref 0–32)
AST: 17 IU/L (ref 0–40)
Albumin/Globulin Ratio: 1.8 (ref 1.2–2.2)
Albumin: 4.1 g/dL (ref 3.9–5.0)
Alkaline Phosphatase: 64 IU/L (ref 42–106)
BUN/Creatinine Ratio: 9 (ref 9–23)
BUN: 8 mg/dL (ref 6–20)
Bilirubin Total: 1.5 mg/dL — ABNORMAL HIGH (ref 0.0–1.2)
CO2: 22 mmol/L (ref 20–29)
Calcium: 9.5 mg/dL (ref 8.7–10.2)
Chloride: 104 mmol/L (ref 96–106)
Creatinine, Ser: 0.92 mg/dL (ref 0.57–1.00)
Globulin, Total: 2.3 g/dL (ref 1.5–4.5)
Glucose: 89 mg/dL (ref 65–99)
Potassium: 4.1 mmol/L (ref 3.5–5.2)
Sodium: 140 mmol/L (ref 134–144)
Total Protein: 6.4 g/dL (ref 6.0–8.5)
eGFR: 91 mL/min/{1.73_m2} (ref >59)

## 2020-07-26 LAB — SICKLE CELL SCREEN: Sickle Cell Screen: POSITIVE — AB

## 2020-07-26 NOTE — Progress Notes (Signed)
Can you ask Victorino Dike if we can add the recommended addl testing on? Thx.

## 2020-07-27 ENCOUNTER — Encounter: Payer: Self-pay | Admitting: Obstetrics and Gynecology

## 2020-07-27 LAB — URINE CULTURE

## 2020-07-28 ENCOUNTER — Encounter: Payer: Self-pay | Admitting: Obstetrics and Gynecology

## 2020-07-28 MED ORDER — NITROFURANTOIN MONOHYD MACRO 100 MG PO CAPS: 100.0000 mg | ORAL_CAPSULE | Freq: Two times a day (BID) | ORAL | 0 refills | Status: AC

## 2020-07-28 NOTE — Addendum Note (Signed)
Addended by: Althea Grimmer B on: 07/28/2020 07:30 AM   Modules accepted: Orders

## 2020-07-31 ENCOUNTER — Other Ambulatory Visit: Payer: Self-pay | Admitting: Obstetrics and Gynecology

## 2020-07-31 DIAGNOSIS — B9689 Other specified bacterial agents as the cause of diseases classified elsewhere: Secondary | ICD-10-CM

## 2020-07-31 DIAGNOSIS — N760 Acute vaginitis: Secondary | ICD-10-CM

## 2020-07-31 MED ORDER — METRONIDAZOLE 0.75 % VA GEL: 1.0000 | Freq: Every day | VAGINAL | 0 refills | Status: AC

## 2020-07-31 NOTE — Progress Notes (Signed)
Rx RF metrogel for recurrent BV sx

## 2020-08-02 LAB — HGB FRACTIONATION CASCADE
Hgb A2: 2.8 % (ref 1.8–3.2)
Hgb A: 56.2 % — ABNORMAL LOW (ref 96.4–98.8)
Hgb F: 0 % (ref 0.0–2.0)
Hgb S: 41 % — ABNORMAL HIGH

## 2020-08-02 LAB — HGB SOLUBILITY: Hgb Solubility: POSITIVE — AB

## 2020-08-02 LAB — SPECIMEN STATUS REPORT

## 2020-08-13 NOTE — Telephone Encounter (Signed)
Patient is scheduled for Tuesday, 08/14/20 at 2 pm for nurse visit

## 2020-08-14 ENCOUNTER — Ambulatory Visit: Payer: BC Managed Care – PPO

## 2020-08-14 ENCOUNTER — Other Ambulatory Visit: Payer: Self-pay

## 2020-08-14 ENCOUNTER — Ambulatory Visit (INDEPENDENT_AMBULATORY_CARE_PROVIDER_SITE_OTHER): Payer: BC Managed Care – PPO

## 2020-08-14 DIAGNOSIS — R310 Gross hematuria: Secondary | ICD-10-CM

## 2020-08-14 LAB — POCT URINALYSIS DIPSTICK
Bilirubin, UA: NEGATIVE
Glucose, UA: NEGATIVE
Ketones, UA: NEGATIVE
Nitrite, UA: POSITIVE
Protein, UA: POSITIVE — AB
Spec Grav, UA: 1.01 (ref 1.010–1.025)
Urobilinogen, UA: 0.2 E.U./dL
pH, UA: 6 (ref 5.0–8.0)

## 2020-08-14 NOTE — Progress Notes (Addendum)
Pt came in and dropped off an urine sample - Very red.  Please see u/a.  Will send for culture and notify pt.  Left msg for pt that specimen was sent for culture and we will wait for the results.

## 2020-08-14 NOTE — Progress Notes (Signed)
No treatment yet, just check C&S. Thx.

## 2020-08-16 ENCOUNTER — Encounter: Payer: Self-pay | Admitting: Obstetrics and Gynecology

## 2020-08-16 LAB — URINE CULTURE: Organism ID, Bacteria: NO GROWTH

## 2020-08-20 NOTE — Progress Notes (Signed)
08/23/20 9:58 AM   Kimberly Roach 04-Apr-2000 [id number]  Referring provider:  Mickie Bail, MD [name] [facility] [place],  [place] [address] Chief Complaint  Patient presents with   Hematuria     HPI: Kimberly Roach is a 20 y.o.female who presents today for gross hematuria.   She had an ED department on 07/09/2020 with hematuria, acute cystitis, left sided abdominal pain, left sided back pain, urinary frequency beginning 07/05/2020. Urinalysis at the time was positive and grew E/coli which is pansensitive   08/14/2020 POCT urinalysis dipstick showed small leukocytes and positive nitrites. Urine culture showed no growth.   Patient has been recently diagnosed with sickle cell trait.   She also has a history of vaginal discharge and was recently diagnosed with vaginal yeast which was treated with Diflucan.   Urine today was Kool-aid colored. With 6-10 WBC, > 30 RBC, nitrate negative, and few bacteria.   Patient noticed blood in urine the beginning of June. She states she bleed every time she urinates. She has been drinking more fluid to try and flush herself out. She denies any pain while urinating or vaginal symptoms.   She said she experienced severe pain in her left flank area. No personal history of stones.     PMH: Past Medical History:  Diagnosis Date   Anxiety    Depression    Wears contact lenses     Surgical History: Past Surgical History:  Procedure Laterality Date   KNEE ARTHROSCOPY Right 04/02/2020   Procedure: RIGHT KNEE ARTHROSCOPY, DEBRIDEMENT;  Surgeon: Cammy Copa, MD;  Location: [facility];  Service: Orthopedics;  Laterality: Right;   KNEE ARTHROSCOPY WITH LATERAL MENISECTOMY Right 04/02/2020   Procedure: KNEE ARTHROSCOPY WITH LATERAL Partial MENISECTOMY;  Surgeon: Cammy Copa, MD;  Location: [facility];  Service: Orthopedics;  Laterality: Right;   TONSILLECTOMY      TONSILLECTOMY AND ADENOIDECTOMY N/A 11/22/2014   Procedure: TONSILLECTOMY AND ADENOIDECTOMY;  Surgeon: Bud Face, MD;  Location: [facility];  Service: ENT;  Laterality: N/A;  ADENOIDS CAUTERIZED NO TISSUE SENT FOR PATHOLOGY    Home Medications:  Allergies as of 08/22/2020   No Known Allergies      Medication List        Accurate as of August 22, 2020 11:59 PM. If you have any questions, ask your nurse or doctor.          medroxyPROGESTERone 150 MG/ML injection Commonly known as: DEPO-PROVERA Inject 150 mg into the muscle every 3 (three) months. What changed: Another medication with the same name was removed. Continue taking this medication, and follow the directions you see here. Changed by: Vanna Scotland, MD        Allergies: No Known Allergies  Family History: Family History  Problem Relation Age of Onset   Healthy Mother    Anxiety disorder Father    Depression Father    Healthy Father     Social History:  reports that she has never smoked. She has never used smokeless tobacco. She reports current alcohol use of about 1.0 standard drink of alcohol per week. She reports current drug use. Frequency: 1.00 time per week. Drug: Marijuana.   Physical Exam: BP 114/78   Pulse (!) 102   Ht 5\' 2"  (1.575 m)   Wt 120 lb (54.4 kg)   LMP  (LMP Unknown) Comment: pt on DEPO  BMI 21.95 kg/m   Constitutional:  Alert and oriented,  No acute distress. HEENT: [place] AT, moist mucus membranes.  Trachea midline, no masses. Cardiovascular: No clubbing, cyanosis, or edema. Respiratory: Normal respiratory effort, no increased work of breathing. Skin: No rashes, bruises or suspicious lesions. Neurologic: Grossly intact, no focal deficits, moving all 4 extremities. Psychiatric: Normal mood and affect.  Laboratory Data:  Lab Results  Component Value Date   CREATININE 0.92 07/25/2020     Urinalysis Urine today was Kool-aid colored. With 6-10 WBC, > 30 RBC, nitrate  negative, and few bacteria.   Pertinent Imaging: Sent for a KUB today.   Assessment & Plan:    Recurrent UTI  -Few episodes of bacteriuria but in the absence of urinary symptoms other than gross hematuria -Unclear about whether this is the cause of her gross hematuria  Gross hematuria  - based on degree of blood in urine will recommend hematuria evaluation with cysto and renal ultrasound.  - will send for culture today, for microplasma and uroplasma  -Differential diagnosis at her age group includes kidney stones, underlying medical renal disease, and possibly papillary necrosis in the setting of sickle cell trait amongst others  3. Left flank pain  - KUB scheduled today to rule out stones. May eventually need a CT scan.   Will call her with KUB results follow-up with a cystoscopy and renal ultrasound.   Tawni Millers as a scribe for Vanna Scotland, MD.,have documented all relevant documentation on the behalf of Vanna Scotland, MD,as directed by  Vanna Scotland, MD while in the presence of Vanna Scotland, MD.  Vanna Scotland, MD    [facility] [address] [place], [place] [address] [phone]

## 2020-08-22 ENCOUNTER — Ambulatory Visit (INDEPENDENT_AMBULATORY_CARE_PROVIDER_SITE_OTHER): Payer: BLUE CROSS/BLUE SHIELD | Admitting: Urology

## 2020-08-22 ENCOUNTER — Ambulatory Visit
Admission: RE | Admit: 2020-08-22 | Discharge: 2020-08-22 | Disposition: A | Discharge status: Home / Self Care | Payer: BLUE CROSS/BLUE SHIELD | Source: Ambulatory Visit | Attending: Urology | Admitting: Urology

## 2020-08-22 ENCOUNTER — Other Ambulatory Visit: Payer: Self-pay

## 2020-08-22 ENCOUNTER — Encounter: Payer: Self-pay | Admitting: Urology

## 2020-08-22 VITALS — BP 114/78 | HR 102 | Ht 62.0 in | Wt 120.0 lb

## 2020-08-22 DIAGNOSIS — R109 Unspecified abdominal pain: Secondary | ICD-10-CM | POA: Insufficient documentation

## 2020-08-22 DIAGNOSIS — R310 Gross hematuria: Secondary | ICD-10-CM | POA: Diagnosis not present

## 2020-08-22 LAB — URINALYSIS, COMPLETE
Bilirubin, UA: NEGATIVE
Glucose, UA: NEGATIVE
Ketones, UA: NEGATIVE
Nitrite, UA: NEGATIVE
Specific Gravity, UA: 1.015 (ref 1.005–1.030)
Urobilinogen, Ur: 0.2 mg/dL (ref 0.2–1.0)
pH, UA: 6.5 (ref 5.0–7.5)

## 2020-08-22 LAB — MICROSCOPIC EXAMINATION: RBC, Urine: >30 /hpf — AB (ref 0–2)

## 2020-08-22 NOTE — Patient Instructions (Signed)
Cystoscopy Cystoscopy is a procedure that is used to help diagnose and sometimes treat conditions that affect the lower urinary tract. The lower urinary tract includes the bladder and the urethra. The urethra is the tube that drains urine from the bladder. Cystoscopy is done using a thin, tube-shaped instrument with a light and camera at the end (cystoscope). The cystoscope may be hard or flexible, depending on the goal of the procedure. The cystoscope is inserted through the urethra, into the bladder. Cystoscopy may be recommended if you have: Urinary tract infections that keep coming back. Blood in the urine (hematuria). An inability to control when you urinate (urinary incontinence) or an overactive bladder. Unusual cells found in a urine sample. A blockage in the urethra, such as a urinary stone. Painful urination. An abnormality in the bladder found during an intravenous pyelogram (IVP) or CT scan. Cystoscopy may also be done to remove a sample of tissue to be examined under a microscope (biopsy). What are the risks? Generally, this is a safe procedure. However, problems may occur, including: Infection. Bleeding.  What happens during the procedure?  You will be given one or more of the following: A medicine to numb the area (local anesthetic). The area around the opening of your urethra will be cleaned. The cystoscope will be passed through your urethra into your bladder. Germ-free (sterile) fluid will flow through the cystoscope to fill your bladder. The fluid will stretch your bladder so that your health care provider can clearly examine your bladder walls. Your doctor will look at the urethra and bladder. The cystoscope will be removed The procedure may vary among health care providers  What can I expect after the procedure? After the procedure, it is common to have: Some soreness or pain in your abdomen and urethra. Urinary symptoms. These include: Mild pain or burning when you  urinate. Pain should stop within a few minutes after you urinate. This may last for up to 1 week. A small amount of blood in your urine for several days. Feeling like you need to urinate but producing only a small amount of urine. Follow these instructions at home: General instructions Return to your normal activities as told by your health care provider.  Do not drive for 24 hours if you were given a sedative during your procedure. Watch for any blood in your urine. If the amount of blood in your urine increases, call your health care provider. If a tissue sample was removed for testing (biopsy) during your procedure, it is up to you to get your test results. Ask your health care provider, or the department that is doing the test, when your results will be ready. Drink enough fluid to keep your urine pale yellow. Keep all follow-up visits as told by your health care provider. This is important. Contact a health care provider if you: Have pain that gets worse or does not get better with medicine, especially pain when you urinate. Have trouble urinating. Have more blood in your urine. Get help right away if you: Have blood clots in your urine. Have abdominal pain. Have a fever or chills. Are unable to urinate. Summary Cystoscopy is a procedure that is used to help diagnose and sometimes treat conditions that affect the lower urinary tract. Cystoscopy is done using a thin, tube-shaped instrument with a light and camera at the end. After the procedure, it is common to have some soreness or pain in your abdomen and urethra. Watch for any blood in your urine.   If the amount of blood in your urine increases, call your health care provider. If you were prescribed an antibiotic medicine, take it as told by your health care provider. Do not stop taking the antibiotic even if you start to feel better. This information is not intended to replace advice given to you by your health care provider. Make  sure you discuss any questions you have with your health care provider. Document Revised: 12/29/2017 Document Reviewed: 12/29/2017 Elsevier Patient Education  2020 Elsevier Inc.  

## 2020-08-25 LAB — CULTURE, URINE COMPREHENSIVE

## 2020-08-27 ENCOUNTER — Telehealth: Payer: Self-pay | Admitting: *Deleted

## 2020-08-27 ENCOUNTER — Encounter: Payer: Self-pay | Admitting: Urology

## 2020-08-27 LAB — MYCOPLASMA / UREAPLASMA CULTURE
Mycoplasma hominis Culture: POSITIVE — AB
Ureaplasma urealyticum: POSITIVE — AB

## 2020-08-27 NOTE — Telephone Encounter (Addendum)
Left patient a VM with details asked to return all with any questions.   ----- Message from Vanna Scotland, MD sent at 08/24/2020  2:09 PM EDT ----- X-ray looks fine, no obvious kidney stones.  Vanna Scotland, MD

## 2020-08-28 ENCOUNTER — Telehealth: Payer: Self-pay | Admitting: Family Medicine

## 2020-08-28 ENCOUNTER — Encounter: Payer: Self-pay | Admitting: Urology

## 2020-08-28 MED ORDER — DOXYCYCLINE HYCLATE 100 MG PO CAPS: 100.0000 mg | ORAL_CAPSULE | Freq: Two times a day (BID) | ORAL | 0 refills | Status: DC

## 2020-08-28 NOTE — Telephone Encounter (Signed)
Patient called and saw her urine results in Mychart and wants to know what the positive result means or the Mycoplasma / Ureaplasma Culture. Please advise.

## 2020-08-28 NOTE — Telephone Encounter (Addendum)
Left patient with VM details-sent to pharmacy  ----- Message from Vanna Scotland, MD sent at 08/28/2020 11:27 AM EDT ----- Mycoplasma and Ureaplasma are both positive.  Please treat with Doxycycline 100 mg twice a day x 7 days.  Vanna Scotland, MD

## 2020-09-10 ENCOUNTER — Ambulatory Visit: Payer: Self-pay | Admitting: Obstetrics and Gynecology

## 2020-09-10 ENCOUNTER — Ambulatory Visit: Payer: BLUE CROSS/BLUE SHIELD | Admitting: Obstetrics and Gynecology

## 2020-09-12 ENCOUNTER — Other Ambulatory Visit: Payer: Self-pay | Admitting: Urology

## 2020-09-14 ENCOUNTER — Other Ambulatory Visit: Payer: Self-pay

## 2020-09-14 ENCOUNTER — Ambulatory Visit
Admission: RE | Admit: 2020-09-14 | Discharge: 2020-09-14 | Disposition: A | Discharge status: Home / Self Care | Payer: BLUE CROSS/BLUE SHIELD | Source: Ambulatory Visit | Attending: Urology | Admitting: Urology

## 2020-09-14 DIAGNOSIS — R310 Gross hematuria: Secondary | ICD-10-CM | POA: Diagnosis present

## 2020-09-17 ENCOUNTER — Telehealth: Payer: Self-pay | Admitting: *Deleted

## 2020-09-17 NOTE — Telephone Encounter (Addendum)
Left patient a Vm with details, asked to return call with any questions.    ----- Message from Vanna Scotland, MD sent at 09/17/2020  8:33 AM EDT ----- Renal ultrasound is normal, great news  Vanna Scotland, MD

## 2020-09-18 NOTE — Progress Notes (Incomplete)
   09/18/20  CC: No chief complaint on file.    HPI: Kimberly Roach is a 20 y.o.female with a personal history of gross hematuria who returns today for cystoscopy and RUS results.   On 07/09/2020 she was seen in the ED with hematuria, acute cystitis, left sided back pain, urinary frequency beginning 07/05/2020. Urinalysis at the time was positive and grew E/coli which is pansensitive .   She has a history of vaginal discharge and has been recently diagnosed with sickle cell trait.   09/14/2020 RUS showed Unremarkable sonographic appearance of the kidneys and bladder and no explanation for hematuria.     There were no vitals filed for this visit. NED. A&Ox3.   No respiratory distress   Abd soft, NT, ND Normal external genitalia with patent urethral meatus  Cystoscopy Procedure Note  Patient identification was confirmed, informed consent was obtained, and patient was prepped using Betadine solution.  Lidocaine jelly was administered per urethral meatus.    Procedure: - Flexible cystoscope introduced, without any difficulty.   - Thorough search of the bladder revealed:    normal urethral meatus    normal urothelium    no stones    no ulcers     no tumors    no urethral polyps    no trabeculation  - Ureteral orifices were normal in position and appearance.  Post-Procedure: - Patient tolerated the procedure well  Assessment/ Plan:    No follow-ups on file.  I,Kailey Littlejohn,acting as a Neurosurgeon for Vanna Scotland, MD.,have documented all relevant documentation on the behalf of Vanna Scotland, MD,as directed by  Vanna Scotland, MD while in the presence of Vanna Scotland, MD.

## 2020-09-19 ENCOUNTER — Other Ambulatory Visit: Payer: Self-pay

## 2020-09-19 ENCOUNTER — Other Ambulatory Visit: Payer: BLUE CROSS/BLUE SHIELD | Admitting: Urology

## 2020-09-23 ENCOUNTER — Emergency Department (HOSPITAL_COMMUNITY)
Admission: EM | Admit: 2020-09-23 | Discharge: 2020-09-23 | Disposition: A | Discharge status: Home / Self Care | Payer: BLUE CROSS/BLUE SHIELD | Attending: Emergency Medicine | Admitting: Emergency Medicine

## 2020-09-23 ENCOUNTER — Encounter (HOSPITAL_COMMUNITY): Payer: Self-pay | Admitting: Emergency Medicine

## 2020-09-23 DIAGNOSIS — R1032 Left lower quadrant pain: Secondary | ICD-10-CM | POA: Insufficient documentation

## 2020-09-23 DIAGNOSIS — R102 Pelvic and perineal pain: Secondary | ICD-10-CM | POA: Diagnosis present

## 2020-09-23 DIAGNOSIS — R319 Hematuria, unspecified: Secondary | ICD-10-CM | POA: Insufficient documentation

## 2020-09-23 DIAGNOSIS — D649 Anemia, unspecified: Secondary | ICD-10-CM | POA: Diagnosis not present

## 2020-09-23 DIAGNOSIS — R109 Unspecified abdominal pain: Secondary | ICD-10-CM

## 2020-09-23 DIAGNOSIS — Z96641 Presence of right artificial hip joint: Secondary | ICD-10-CM | POA: Insufficient documentation

## 2020-09-23 DIAGNOSIS — N9489 Other specified conditions associated with female genital organs and menstrual cycle: Secondary | ICD-10-CM | POA: Insufficient documentation

## 2020-09-23 LAB — CBC
HCT: 23 % — ABNORMAL LOW (ref 36.0–46.0)
Hemoglobin: 7 g/dL — ABNORMAL LOW (ref 12.0–15.0)
MCH: 22.5 pg — ABNORMAL LOW (ref 26.0–34.0)
MCHC: 30.4 g/dL (ref 30.0–36.0)
MCV: 74 fL — ABNORMAL LOW (ref 80.0–100.0)
Platelets: 442 10*3/uL — ABNORMAL HIGH (ref 150–400)
RBC: 3.11 MIL/uL — ABNORMAL LOW (ref 3.87–5.11)
RDW: 18.8 % — ABNORMAL HIGH (ref 11.5–15.5)
WBC: 4.1 10*3/uL (ref 4.0–10.5)
nRBC: 0 % (ref 0.0–0.2)

## 2020-09-23 LAB — URINALYSIS, ROUTINE W REFLEX MICROSCOPIC
Glucose, UA: NEGATIVE mg/dL
Ketones, ur: NEGATIVE mg/dL
Nitrite: POSITIVE — AB
Protein, ur: 100 mg/dL — AB
Specific Gravity, Urine: 1.02 (ref 1.005–1.030)
pH: 7 (ref 5.0–8.0)

## 2020-09-23 LAB — I-STAT BETA HCG BLOOD, ED (MC, WL, AP ONLY): I-stat hCG, quantitative: <5 m[IU]/mL (ref <5)

## 2020-09-23 LAB — COMPREHENSIVE METABOLIC PANEL
ALT: 14 U/L (ref 0–44)
AST: 21 U/L (ref 15–41)
Albumin: 3.7 g/dL (ref 3.5–5.0)
Alkaline Phosphatase: 44 U/L (ref 38–126)
Anion gap: 10 (ref 5–15)
BUN: 7 mg/dL (ref 6–20)
CO2: 21 mmol/L — ABNORMAL LOW (ref 22–32)
Calcium: 9 mg/dL (ref 8.9–10.3)
Chloride: 108 mmol/L (ref 98–111)
Creatinine, Ser: 0.85 mg/dL (ref 0.44–1.00)
GFR, Estimated: >60 mL/min (ref >60)
Glucose, Bld: 96 mg/dL (ref 70–99)
Potassium: 3.8 mmol/L (ref 3.5–5.1)
Sodium: 139 mmol/L (ref 135–145)
Total Bilirubin: 0.7 mg/dL (ref 0.3–1.2)
Total Protein: 6.5 g/dL (ref 6.5–8.1)

## 2020-09-23 LAB — URINALYSIS, MICROSCOPIC (REFLEX)
Bacteria, UA: NONE SEEN
RBC / HPF: >50 RBC/hpf (ref 0–5)

## 2020-09-23 LAB — LIPASE, BLOOD: Lipase: 32 U/L (ref 11–51)

## 2020-09-23 NOTE — ED Provider Notes (Addendum)
[facility] EMERGENCY DEPARTMENT Provider Note   CSN: [id number] Arrival date & time: 09/23/20  1140     History Chief Complaint  Patient presents with   Pelvic Pain   Hematuria    Kimberly Roach is a 20 y.o. female.  20 year old female presents with complaint of ongoing left flank pain x3 months.  States that she had significant pain when she came in today however pain has since resolved.  This is typical of her pain pattern for the past 3 months.  Also reports hematuria for the past 3 months.  Patient has been to urology who has done a KUB, renal ultrasound and plan for cystoscopy however when she went for her cystoscopy on 8/31 she was told her insurance was not accepted at the facility.  Patient has been to urgent care as well as her gynecologist for the same problem.  Has been treated for a UTI with antibiotics without improvement in her hematuria and pain.      Past Medical History:  Diagnosis Date   Anxiety    Depression    Wears contact lenses     Patient Active Problem List   Diagnosis Date Noted   Gross hematuria 07/25/2020   Complex tear of lateral meniscus of right knee as current injury    Right knee pain 09/14/2019   Marijuana abuse 12/31/2017   Other specified anxiety disorders 12/24/2017   MDD (major depressive disorder), recurrent episode, moderate (HCC) 12/08/2017   Other constipation 03/28/2016   Vaginal discharge 03/28/2016   Bacterial vaginosis 07/23/2014   Recurrent UTI 07/23/2014    Past Surgical History:  Procedure Laterality Date   KNEE ARTHROSCOPY Right 04/02/2020   Procedure: RIGHT KNEE ARTHROSCOPY, DEBRIDEMENT;  Surgeon: Cammy Copa, MD;  Location: [facility];  Service: Orthopedics;  Laterality: Right;   KNEE ARTHROSCOPY WITH LATERAL MENISECTOMY Right 04/02/2020   Procedure: KNEE ARTHROSCOPY WITH LATERAL Partial MENISECTOMY;  Surgeon: Cammy Copa, MD;  Location: [facility];   Service: Orthopedics;  Laterality: Right;   TONSILLECTOMY     TONSILLECTOMY AND ADENOIDECTOMY N/A 11/22/2014   Procedure: TONSILLECTOMY AND ADENOIDECTOMY;  Surgeon: Bud Face, MD;  Location: [facility];  Service: ENT;  Laterality: N/A;  ADENOIDS CAUTERIZED NO TISSUE SENT FOR PATHOLOGY     OB History     Gravida  1   Para      Term      Preterm      AB  1   Living         SAB  1   IAB      Ectopic      Multiple      Live Births              Family History  Problem Relation Age of Onset   Healthy Mother    Anxiety disorder Father    Depression Father    Healthy Father     Social History   Tobacco Use   Smoking status: Never   Smokeless tobacco: Never  Vaping Use   Vaping Use: Every day  Substance Use Topics   Alcohol use: Yes    Alcohol/week: 1.0 standard drink    Types: 1 Glasses of wine per week   Drug use: Yes    Frequency: 1.0 times per week    Types: Marijuana    Home Medications Prior to Admission medications   Medication Sig Start Date End Date Taking? Authorizing Provider  doxycycline (VIBRAMYCIN) 100 MG capsule Take 1 capsule (100 mg total) by mouth 2 (two) times daily. 08/28/20   Vanna Scotland, MD  medroxyPROGESTERone (DEPO-PROVERA) 150 MG/ML injection Inject 150 mg into the muscle every 3 (three) months.    [provider]  albuterol (PROVENTIL) (2.5 MG/3ML) 0.083% nebulizer solution Take 3 mLs (2.5 mg total) by nebulization every 6 (six) hours as needed for wheezing or shortness of breath. 03/12/18 11/06/18  Linus Mako B, NP  ipratropium (ATROVENT) 0.06 % nasal spray Place 2 sprays into both nostrils 3 (three) times daily as needed for rhinitis. 11/06/18 11/25/18  Georgetta Haber, NP    Allergies    Patient has no known allergies.  Review of Systems   Review of Systems  Constitutional:  Negative for fever.  Respiratory:  Negative for shortness of breath.   Cardiovascular:  Negative for chest pain.   Gastrointestinal:  Positive for abdominal pain. Negative for constipation, diarrhea, nausea and vomiting.  Genitourinary:  Positive for flank pain and hematuria. Negative for dysuria.  Musculoskeletal:  Negative for arthralgias and myalgias.  Skin:  Negative for rash and wound.  Allergic/Immunologic: Negative for immunocompromised state.  Neurological:  Negative for weakness and numbness.  Hematological:  Negative for adenopathy. Does not bruise/bleed easily.  Psychiatric/Behavioral:  Negative for confusion.   All other systems reviewed and are negative.  Physical Exam Updated Vital Signs BP 129/73 (BP Location: Left Arm)   Pulse (!) 112   Temp 98.4 F (36.9 C) (Oral)   Resp 16   SpO2 100%   Physical Exam Vitals and nursing note reviewed.  Constitutional:      General: She is not in acute distress.    Appearance: She is well-developed. She is not diaphoretic.  HENT:     Head: Normocephalic and atraumatic.  Cardiovascular:     Rate and Rhythm: Normal rate and regular rhythm.     Pulses: Normal pulses.     Heart sounds: Normal heart sounds.  Pulmonary:     Effort: Pulmonary effort is normal.     Breath sounds: Normal breath sounds.  Abdominal:     Palpations: Abdomen is soft.     Tenderness: There is abdominal tenderness in the left lower quadrant. There is no right CVA tenderness or left CVA tenderness.  Skin:    General: Skin is warm and dry.     Findings: No erythema or rash.  Neurological:     Mental Status: She is alert and oriented to person, place, and time.  Psychiatric:        Behavior: Behavior normal.    ED Results / Procedures / Treatments   Labs (all labs ordered are listed, but only abnormal results are displayed) Labs Reviewed  COMPREHENSIVE METABOLIC PANEL - Abnormal; Notable for the following components:      Result Value   CO2 21 (*)    All other components within normal limits  CBC - Abnormal; Notable for the following components:   RBC 3.11  (*)    Hemoglobin 7.0 (*)    HCT 23.0 (*)    MCV 74.0 (*)    MCH 22.5 (*)    RDW 18.8 (*)    Platelets 442 (*)    All other components within normal limits  LIPASE, BLOOD  URINALYSIS, ROUTINE W REFLEX MICROSCOPIC  VITAMIN B12  FOLATE  IRON AND TIBC  FERRITIN  RETICULOCYTES  I-STAT BETA HCG BLOOD, ED ([facility], [facility], [facility] ONLY)  TYPE AND SCREEN  EKG None  Radiology No results found.  Procedures Procedures   Medications Ordered in ED Medications - No data to display  ED Course  I have reviewed the triage vital signs and the nursing notes.  Pertinent labs & imaging results that were available during my care of the patient were reviewed by me and considered in my medical decision making (see chart for details).  Clinical Course as of 09/23/20 1330  Sun Sep 23, 2020  8475 20 year old female presents the ED with ongoing recurrent left flank pain x3 months with gross hematuria.  Patient was evaluated by urology in [place], had a renal ultrasound and KUB with plan for cystoscopy however her insurance was accepted at the facility and she was unable to have her his cystoscopy done on August 31 as scheduled.  At time of exam, pain has resolved.  She does have mild left lower quadrant tenderness.  Patient has been seen by her gynecologist for this complaint. Reviewed urology notes, discussed possibility of obtaining CT scan to evaluate for stones.  Discussed with patient, can check labs to evaluate for anemia, renal function and obtain CT scan.  [LM]  1318 Informed by patient's nurse that she has left the building. Patient's labs have returned, she is anemic with a hemoglobin of 7.0, previously 14.7 one year ago. Contact was made with patient who will call and talk to her mom before deciding what she wants to do regarding her anemia and leaving the emergency room already. [LM]  1325 Spoke with patient's mother with patient's permission. Discussed care plan today including CT and anemia  work up, possible transfusion and consult to urology. Mom plans to call urology on Tuesday (holiday tomorrow) to arrange for follow up which is important in light of her anemia. BP 129/73, mildly tachycardic at time of triage with rate of 112.  Recommend return to any ER at anytime for work up. Start iron supplement.  [LM]    Clinical Course User Index [LM] Alden Hipp   MDM Rules/Calculators/A&P                           Final Clinical Impression(s) / ED Diagnoses Final diagnoses:  Flank pain  Anemia, unspecified type    Rx / DC Orders ED Discharge Orders     None        Jeannie Fend, PA-C 09/23/20 1321    Jeannie Fend, PA-C 09/23/20 1330    Derwood Kaplan, MD 09/23/20 2154

## 2020-09-23 NOTE — ED Triage Notes (Signed)
C/o L pelvic pain and hematuria x 3 months.  States she was scheduled for a cystoscopy but when she arrived they didn't take her insurance.

## 2021-01-14 ENCOUNTER — Ambulatory Visit
Admission: RE | Admit: 2021-01-14 | Discharge: 2021-01-14 | Disposition: A | Discharge status: Home / Self Care | Payer: BLUE CROSS/BLUE SHIELD | Source: Ambulatory Visit | Attending: Family Medicine | Admitting: Family Medicine

## 2021-01-14 ENCOUNTER — Other Ambulatory Visit: Payer: Self-pay

## 2021-01-14 VITALS — BP 102/72 | HR 85 | Temp 98.2°F | Resp 14

## 2021-01-14 DIAGNOSIS — R8290 Unspecified abnormal findings in urine: Secondary | ICD-10-CM | POA: Insufficient documentation

## 2021-01-14 DIAGNOSIS — N760 Acute vaginitis: Secondary | ICD-10-CM | POA: Diagnosis present

## 2021-01-14 DIAGNOSIS — N390 Urinary tract infection, site not specified: Secondary | ICD-10-CM | POA: Insufficient documentation

## 2021-01-14 LAB — POCT URINALYSIS DIP (MANUAL ENTRY)
Bilirubin, UA: NEGATIVE
Blood, UA: NEGATIVE
Glucose, UA: NEGATIVE mg/dL
Ketones, POC UA: NEGATIVE mg/dL
Nitrite, UA: NEGATIVE
Protein Ur, POC: NEGATIVE mg/dL
Spec Grav, UA: >=1.03 — AB (ref 1.010–1.025)
Urobilinogen, UA: 0.2 E.U./dL
pH, UA: 5.5 (ref 5.0–8.0)

## 2021-01-14 MED ORDER — METRONIDAZOLE 500 MG PO TABS: 500.0000 mg | ORAL_TABLET | Freq: Two times a day (BID) | ORAL | 0 refills | Status: DC

## 2021-01-14 NOTE — ED Triage Notes (Signed)
Patient states she thinks that she has a yeast infection or BV starting 3 days ago.  Patient states she has an odor to her vagina but no burning when urinating.   Denies Fever   Denies Medication

## 2021-01-14 NOTE — ED Provider Notes (Signed)
[facility]    CSN: [id number] Arrival date & time: 01/14/21  1207      History   Chief Complaint Chief Complaint  Patient presents with   Female GU Problem   HPI Kimberly Roach is a 20 y.o. female.   Patient presenting today with 2-day history of vaginal odor.  Denies vaginal discharge, rashes, pain or itching, abdominal pain, nausea vomiting or bowel changes.  No new sexual partners.  No concern for STDs.  Compliant with Depo injections, has irregular periods on these so unclear when her last menstrual period was.  She denies any chance of pregnancy.  Not trying anything over-the-counter for symptoms.  Past Medical History:  Diagnosis Date   Anxiety    Depression    Wears contact lenses     Patient Active Problem List   Diagnosis Date Noted   Gross hematuria 07/25/2020   Complex tear of lateral meniscus of right knee as current injury    Right knee pain 09/14/2019   Marijuana abuse 12/31/2017   Other specified anxiety disorders 12/24/2017   MDD (major depressive disorder), recurrent episode, moderate (HCC) 12/08/2017   Other constipation 03/28/2016   Vaginal discharge 03/28/2016   Bacterial vaginosis 07/23/2014   Recurrent UTI 07/23/2014    Past Surgical History:  Procedure Laterality Date   KNEE ARTHROSCOPY Right 04/02/2020   Procedure: RIGHT KNEE ARTHROSCOPY, DEBRIDEMENT;  Surgeon: Cammy Copa, MD;  Location: [facility];  Service: Orthopedics;  Laterality: Right;   KNEE ARTHROSCOPY WITH LATERAL MENISECTOMY Right 04/02/2020   Procedure: KNEE ARTHROSCOPY WITH LATERAL Partial MENISECTOMY;  Surgeon: Cammy Copa, MD;  Location: [facility];  Service: Orthopedics;  Laterality: Right;   TONSILLECTOMY     TONSILLECTOMY AND ADENOIDECTOMY N/A 11/22/2014   Procedure: TONSILLECTOMY AND ADENOIDECTOMY;  Surgeon: Bud Face, MD;  Location: [facility];  Service: ENT;  Laterality: N/A;  ADENOIDS CAUTERIZED NO  TISSUE SENT FOR PATHOLOGY    OB History     Gravida  1   Para      Term      Preterm      AB  1   Living         SAB  1   IAB      Ectopic      Multiple      Live Births               Home Medications    Prior to Admission medications   Medication Sig Start Date End Date Taking? Authorizing Provider  metroNIDAZOLE (FLAGYL) 500 MG tablet Take 1 tablet (500 mg total) by mouth 2 (two) times daily. 01/14/21  Yes Particia Nearing, PA-C  doxycycline (VIBRAMYCIN) 100 MG capsule Take 1 capsule (100 mg total) by mouth 2 (two) times daily. 08/28/20   Vanna Scotland, MD  medroxyPROGESTERone (DEPO-PROVERA) 150 MG/ML injection Inject 150 mg into the muscle every 3 (three) months.    [provider]  albuterol (PROVENTIL) (2.5 MG/3ML) 0.083% nebulizer solution Take 3 mLs (2.5 mg total) by nebulization every 6 (six) hours as needed for wheezing or shortness of breath. 03/12/18 11/06/18  Linus Mako B, NP  ipratropium (ATROVENT) 0.06 % nasal spray Place 2 sprays into both nostrils 3 (three) times daily as needed for rhinitis. 11/06/18 11/25/18  Georgetta Haber, NP    Family History Family History  Problem Relation Age of Onset   Healthy Mother    Anxiety disorder Father  Depression Father    Healthy Father     Social History Social History   Tobacco Use   Smoking status: Never   Smokeless tobacco: Never  Vaping Use   Vaping Use: Former  Substance Use Topics   Alcohol use: Yes    Alcohol/week: 1.0 standard drink    Types: 1 Glasses of wine per week    Comment: Ocasionally   Drug use: Yes    Frequency: 1.0 times per week    Types: Marijuana     Allergies   Patient has no known allergies.   Review of Systems Review of Systems Per HPI  Physical Exam Triage Vital Signs ED Triage Vitals  Enc Vitals Group     BP 01/14/21 1259 102/72     Pulse Rate 01/14/21 1259 85     Resp 01/14/21 1259 14     Temp 01/14/21 1259 98.2 F (36.8 C)      Temp Source 01/14/21 1259 Oral     SpO2 01/14/21 1259 96 %     Weight --      Height --      Head Circumference --      Peak Flow --      Pain Score 01/14/21 1256 0     Pain Loc --      Pain Edu? --      Excl. in [place]? --    No data found.  Updated Vital Signs BP 102/72 (BP Location: Right Arm)    Pulse 85    Temp 98.2 F (36.8 C) (Oral)    Resp 14    SpO2 96%   Visual Acuity Right Eye Distance:   Left Eye Distance:   Bilateral Distance:    Right Eye Near:   Left Eye Near:    Bilateral Near:     Physical Exam Vitals and nursing note reviewed.  Constitutional:      Appearance: Normal appearance. She is not ill-appearing.  HENT:     Head: Atraumatic.  Eyes:     Extraocular Movements: Extraocular movements intact.     Conjunctiva/sclera: Conjunctivae normal.  Cardiovascular:     Rate and Rhythm: Normal rate and regular rhythm.     Heart sounds: Normal heart sounds.  Pulmonary:     Effort: Pulmonary effort is normal.     Breath sounds: Normal breath sounds.  Abdominal:     General: Bowel sounds are normal. There is no distension.     Palpations: Abdomen is soft.     Tenderness: There is no abdominal tenderness. There is no guarding.  Genitourinary:    Comments: GU exam deferred, self swab performed Musculoskeletal:        General: Normal range of motion.     Cervical back: Normal range of motion and neck supple.  Skin:    General: Skin is warm and dry.  Neurological:     Mental Status: She is alert and oriented to person, place, and time.  Psychiatric:        Mood and Affect: Mood normal.        Thought Content: Thought content normal.        Judgment: Judgment normal.     UC Treatments / Results  Labs (all labs ordered are listed, but only abnormal results are displayed) Labs Reviewed  POCT URINALYSIS DIP (MANUAL ENTRY) - Abnormal; Notable for the following components:      Result Value   Spec Grav, UA >=1.030 (*)    Leukocytes, UA  Trace (*)    All  other components within normal limits  URINE CULTURE  CERVICOVAGINAL ANCILLARY ONLY    EKG   Radiology No results found.  Procedures Procedures (including critical care time)  Medications Ordered in UC Medications - No data to display  Initial Impression / Assessment and Plan / UC Course  I have reviewed the triage vital signs and the nursing notes.  Pertinent labs & imaging results that were available during my care of the patient were reviewed by me and considered in my medical decision making (see chart for details).      Vitals and exam reassuring, per patient history of bacterial vaginosis that presents similarly so will cover with metronidazole while awaiting vaginal swab results.  Urine culture pending as small leuks present in urine and history of urinary tract infections.  Very low suspicion for this, suspect vaginal infection.  Discussed supportive over-the-counter home care and return precautions.  Final Clinical Impressions(s) / UC Diagnoses   Final diagnoses:  vaginitis  Abnormal urinalysis  Acute vaginitis   Discharge Instructions   None    ED Prescriptions     Medication Sig Dispense Auth. Provider   metroNIDAZOLE (FLAGYL) 500 MG tablet Take 1 tablet (500 mg total) by mouth 2 (two) times daily. 14 tablet Volney American, [place]      PDMP not reviewed this encounter.   Volney American, [place] 01/14/21 1327

## 2021-01-15 LAB — CERVICOVAGINAL ANCILLARY ONLY
Bacterial Vaginitis (gardnerella): POSITIVE — AB
Candida Glabrata: NEGATIVE
Candida Vaginitis: NEGATIVE
Chlamydia: POSITIVE — AB
Comment: NEGATIVE
Comment: NEGATIVE
Comment: NEGATIVE
Comment: NEGATIVE
Comment: NEGATIVE
Comment: NORMAL
Neisseria Gonorrhea: NEGATIVE
Trichomonas: NEGATIVE

## 2021-01-15 LAB — URINE CULTURE: Culture: <10000 — AB

## 2021-01-16 ENCOUNTER — Ambulatory Visit
Admission: EM | Admit: 2021-01-16 | Discharge: 2021-01-16 | Disposition: A | Discharge status: Home / Self Care | Payer: BLUE CROSS/BLUE SHIELD

## 2021-01-16 ENCOUNTER — Ambulatory Visit: Payer: BLUE CROSS/BLUE SHIELD

## 2021-01-16 ENCOUNTER — Other Ambulatory Visit: Payer: Self-pay

## 2021-01-16 ENCOUNTER — Telehealth (HOSPITAL_COMMUNITY): Payer: Self-pay | Admitting: Emergency Medicine

## 2021-01-16 ENCOUNTER — Telehealth: Payer: Self-pay

## 2021-01-16 MED ORDER — DOXYCYCLINE HYCLATE 100 MG PO CAPS: 100.0000 mg | ORAL_CAPSULE | Freq: Two times a day (BID) | ORAL | 0 refills | Status: DC

## 2021-01-16 NOTE — ED Notes (Signed)
No visit needed, meds sent [facility] per protocol

## 2021-01-17 ENCOUNTER — Telehealth: Payer: Self-pay | Admitting: Emergency Medicine

## 2021-01-17 MED ORDER — DOXYCYCLINE HYCLATE 100 MG PO CAPS: 100.0000 mg | ORAL_CAPSULE | Freq: Two times a day (BID) | ORAL | 0 refills | Status: AC

## 2021-01-17 MED ORDER — ONDANSETRON HCL 4 MG PO TABS: 4.0000 mg | ORAL_TABLET | Freq: Three times a day (TID) | ORAL | 0 refills | Status: DC | PRN

## 2021-01-17 NOTE — Telephone Encounter (Signed)
Patient called and states she is vomiting up her antibiotic.  Dr. Chaney Malling notified and will call in nausea medication and more antibiotic for the doses she missed.

## 2021-01-23 ENCOUNTER — Encounter (HOSPITAL_COMMUNITY): Payer: Self-pay

## 2021-01-23 ENCOUNTER — Other Ambulatory Visit: Payer: Self-pay

## 2021-01-23 ENCOUNTER — Ambulatory Visit (HOSPITAL_COMMUNITY)
Admission: EM | Admit: 2021-01-23 | Discharge: 2021-01-23 | Disposition: A | Discharge status: Home / Self Care | Payer: Managed Care, Other (non HMO) | Attending: Emergency Medicine | Admitting: Emergency Medicine

## 2021-01-23 DIAGNOSIS — Z202 Contact with and (suspected) exposure to infections with a predominantly sexual mode of transmission: Secondary | ICD-10-CM | POA: Diagnosis not present

## 2021-01-23 DIAGNOSIS — Z113 Encounter for screening for infections with a predominantly sexual mode of transmission: Secondary | ICD-10-CM | POA: Insufficient documentation

## 2021-01-23 LAB — RPR: RPR Ser Ql: NONREACTIVE

## 2021-01-23 LAB — HIV ANTIBODY (ROUTINE TESTING W REFLEX): HIV Screen 4th Generation wRfx: NONREACTIVE

## 2021-01-23 NOTE — ED Triage Notes (Signed)
Pt presents to the office for recheck for chlamydia infection. She has been sexually active since she finish her doxycycline. She does not complain of any vaginal discharge at this time.

## 2021-01-23 NOTE — ED Provider Notes (Signed)
[facility]    CSN: [id number] Arrival date & time: 01/23/21  0802      History   Chief Complaint Chief Complaint  Patient presents with   Exposure to STD    HPI Kimberly Roach is a 21 y.o. female.   Patient presents requesting recheck for STI infections.  Tested positive for bacterial vaginosis and chlamydia on 01/14/2021, endorses that she and partner completed all medications.  Has been sexually active since medication completed, 1 partner, condom use.  Denies all symptoms.  Past Medical History:  Diagnosis Date   Anxiety    Depression    Wears contact lenses     Patient Active Problem List   Diagnosis Date Noted   Gross hematuria 07/25/2020   Complex tear of lateral meniscus of right knee as current injury    Right knee pain 09/14/2019   Marijuana abuse 12/31/2017   Other specified anxiety disorders 12/24/2017   MDD (major depressive disorder), recurrent episode, moderate ([place]) 12/08/2017   Other constipation 03/28/2016   Vaginal discharge 03/28/2016   Bacterial vaginosis 07/23/2014   Recurrent UTI 07/23/2014    Past Surgical History:  Procedure Laterality Date   KNEE ARTHROSCOPY Right 04/02/2020   Procedure: RIGHT KNEE ARTHROSCOPY, DEBRIDEMENT;  Surgeon: Meredith Pel, MD;  Location: [facility];  Service: Orthopedics;  Laterality: Right;   KNEE ARTHROSCOPY WITH LATERAL MENISECTOMY Right 04/02/2020   Procedure: KNEE ARTHROSCOPY WITH LATERAL Partial MENISECTOMY;  Surgeon: Meredith Pel, MD;  Location: [facility];  Service: Orthopedics;  Laterality: Right;   TONSILLECTOMY     TONSILLECTOMY AND ADENOIDECTOMY N/A 11/22/2014   Procedure: TONSILLECTOMY AND ADENOIDECTOMY;  Surgeon: Carloyn Manner, MD;  Location: [facility];  Service: ENT;  Laterality: N/A;  ADENOIDS CAUTERIZED NO TISSUE SENT FOR PATHOLOGY    OB History     Gravida  1   Para      Term      Preterm      AB  1   Living          SAB  1   IAB      Ectopic      Multiple      Live Births               Home Medications    Prior to Admission medications   Medication Sig Start Date End Date Taking? Authorizing Provider  doxycycline (VIBRAMYCIN) 100 MG capsule Take 1 capsule (100 mg total) by mouth 2 (two) times daily. 01/16/21   Jaynee Eagles, PA-C  doxycycline (VIBRAMYCIN) 100 MG capsule Take 1 capsule (100 mg total) by mouth 2 (two) times daily. 01/16/21   Jaynee Eagles, PA-C  medroxyPROGESTERone (DEPO-PROVERA) 150 MG/ML injection Inject 150 mg into the muscle every 3 (three) months.    [provider]  metroNIDAZOLE (FLAGYL) 500 MG tablet Take 1 tablet (500 mg total) by mouth 2 (two) times daily. 01/14/21   Volney American, PA-C  ondansetron (ZOFRAN) 4 MG tablet Take 1 tablet (4 mg total) by mouth 3 (three) times daily as needed for nausea or vomiting. 01/17/21   Melynda Ripple, MD  albuterol (PROVENTIL) (2.5 MG/3ML) 0.083% nebulizer solution Take 3 mLs (2.5 mg total) by nebulization every 6 (six) hours as needed for wheezing or shortness of breath. 03/12/18 11/06/18  Augusto Gamble B, NP  ipratropium (ATROVENT) 0.06 % nasal spray Place 2 sprays into both nostrils 3 (three) times daily as needed for rhinitis. 11/06/18 11/25/18  Zigmund Gottron, NP    Family History Family History  Problem Relation Age of Onset   Healthy Mother    Anxiety disorder Father    Depression Father    Healthy Father     Social History Social History   Tobacco Use   Smoking status: Never   Smokeless tobacco: Never  Vaping Use   Vaping Use: Former  Substance Use Topics   Alcohol use: Yes    Alcohol/week: 1.0 standard drink    Types: 1 Glasses of wine per week    Comment: Ocasionally   Drug use: Yes    Frequency: 1.0 times per week    Types: Marijuana     Allergies   Patient has no known allergies.   Review of Systems Review of Systems  Constitutional: Negative.   Respiratory: Negative.     Genitourinary: Negative.   Skin: Negative.   Neurological: Negative.     Physical Exam Triage Vital Signs ED Triage Vitals [01/23/21 0816]  Enc Vitals Group     BP 118/82     Pulse Rate 80     Resp 16     Temp 98.6 F (37 C)     Temp Source Oral     SpO2 100 %     Weight      Height      Head Circumference      Peak Flow      Pain Score 0     Pain Loc      Pain Edu?      Excl. in [place]?    No data found.  Updated Vital Signs BP 118/82 (BP Location: Left Arm)    Pulse 80    Temp 98.6 F (37 C) (Oral)    Resp 16    SpO2 100%   Visual Acuity Right Eye Distance:   Left Eye Distance:   Bilateral Distance:    Right Eye Near:   Left Eye Near:    Bilateral Near:     Physical Exam Constitutional:      Appearance: Normal appearance.  Eyes:     Extraocular Movements: Extraocular movements intact.  Pulmonary:     Effort: Pulmonary effort is normal.  Genitourinary:    Comments: Deferred self collect vaginal swab Skin:    General: Skin is warm and dry.  Neurological:     Mental Status: She is alert and oriented to person, place, and time. Mental status is at baseline.  Psychiatric:        Mood and Affect: Mood normal.        Behavior: Behavior normal.     UC Treatments / Results  Labs (all labs ordered are listed, but only abnormal results are displayed) Labs Reviewed  HIV ANTIBODY (ROUTINE TESTING W REFLEX)  RPR    EKG   Radiology No results found.  Procedures Procedures (including critical care time)  Medications Ordered in UC Medications - No data to display  Initial Impression / Assessment and Plan / UC Course  I have reviewed the triage vital signs and the nursing notes.  Pertinent labs & imaging results that were available during my care of the patient were reviewed by me and considered in my medical decision making (see chart for details).  Routine screening for STI  Due to recent completion of antibiotic for STI infections, recommended  waiting at least 4 weeks for retesting, patient would like to move forward with reswab today as well as HIV and syphilis testing,  will oblige, recommended abstinence until labs results and/or treatment is complete if needed, recommended continued condom use moving forward Final Clinical Impressions(s) / UC Diagnoses   Final diagnoses:  Routine screening for STI (sexually transmitted infection)     Discharge Instructions      Labs pending 2-3 days, you will be contacted if positive for any sti and treatment will be sent to the pharmacy, you will have to return to the clinic if positive for gonorrhea to receive treatment   May get repeat testing in 4 weeks to confirm infection has resolved, there should be no concerns if both you and your partner have completed treatment    Please refrain from having sex until labs results, if positive please refrain from having sex until treatment complete and symptoms resolve   If positive for  Chlamydia  gonorrhea or trichomoniasis please notify partner or partners so they may tested as well  Moving forward, it is recommended you use some form of protection against the transmission of sti infections  such as condoms or dental dams with each sexual encounter     ED Prescriptions   None    PDMP not reviewed this encounter.   Hans Eden, [place] 01/23/21 [phone]

## 2021-01-23 NOTE — Discharge Instructions (Signed)
Labs pending 2-3 days, you will be contacted if positive for any sti and treatment will be sent to the pharmacy, you will have to return to the clinic if positive for gonorrhea to receive treatment   May get repeat testing in 4 weeks to confirm infection has resolved, there should be no concerns if both you and your partner have completed treatment    Please refrain from having sex until labs results, if positive please refrain from having sex until treatment complete and symptoms resolve   If positive for  Chlamydia  gonorrhea or trichomoniasis please notify partner or partners so they may tested as well  Moving forward, it is recommended you use some form of protection against the transmission of sti infections  such as condoms or dental dams with each sexual encounter

## 2021-01-24 ENCOUNTER — Telehealth (HOSPITAL_COMMUNITY): Payer: Self-pay

## 2021-01-24 LAB — CERVICOVAGINAL ANCILLARY ONLY
Bacterial Vaginitis (gardnerella): POSITIVE — AB
Candida Glabrata: NEGATIVE
Candida Vaginitis: NEGATIVE
Chlamydia: NEGATIVE
Comment: NEGATIVE
Comment: NEGATIVE
Comment: NEGATIVE
Comment: NEGATIVE
Comment: NEGATIVE
Comment: NORMAL
Neisseria Gonorrhea: NEGATIVE
Trichomonas: NEGATIVE

## 2021-01-24 MED ORDER — METRONIDAZOLE 500 MG PO TABS: 500.0000 mg | ORAL_TABLET | Freq: Two times a day (BID) | ORAL | 0 refills | Status: DC

## 2021-02-25 ENCOUNTER — Telehealth: Payer: Self-pay

## 2021-02-25 NOTE — Telephone Encounter (Signed)
Triage call:   Patient is post right knee arthroscopy 03/2020. She works at [employer], having to do a lot of bending. Last night, her right knee swelled and was painful. No specific injury nor incident. She said it is still very swollen this morning. It is not red nor warm to touch. Can bear weight on the leg, but it does hurt some to do so. The patient would like to be worked in with Dr. August Saucer today, if possible.   Her call back number is different from the chart number. She can be reached at [phone].

## 2021-02-25 NOTE — Telephone Encounter (Signed)
I tried calling patient. No answer LMVM advising could work her in Wednesday morning at 930-I scheduled patient for this time and told her to call back if needed. Not sure that we can work her in this afternoon due to Dr August Saucer having several surgeries prior to clinic this afternoon.

## 2021-02-27 ENCOUNTER — Encounter: Payer: Self-pay | Admitting: Orthopedic Surgery

## 2021-02-27 ENCOUNTER — Other Ambulatory Visit: Payer: Self-pay

## 2021-02-27 ENCOUNTER — Ambulatory Visit (INDEPENDENT_AMBULATORY_CARE_PROVIDER_SITE_OTHER): Payer: BLUE CROSS/BLUE SHIELD | Admitting: Orthopedic Surgery

## 2021-02-27 ENCOUNTER — Ambulatory Visit: Payer: Self-pay

## 2021-02-27 DIAGNOSIS — M25561 Pain in right knee: Secondary | ICD-10-CM

## 2021-02-27 DIAGNOSIS — M1711 Unilateral primary osteoarthritis, right knee: Secondary | ICD-10-CM | POA: Diagnosis not present

## 2021-02-27 MED ORDER — METHYLPREDNISOLONE ACETATE 40 MG/ML IJ SUSP
40.0000 mg | INTRAMUSCULAR | Status: AC | PRN
  Administered 2021-02-27: 40 mg via INTRA_ARTICULAR

## 2021-02-27 MED ORDER — BUPIVACAINE HCL 0.25 % IJ SOLN
4.0000 mL | INTRAMUSCULAR | Status: AC | PRN
  Administered 2021-02-27: 4 mL via INTRA_ARTICULAR

## 2021-02-27 MED ORDER — LIDOCAINE HCL 1 % IJ SOLN
5.0000 mL | INTRAMUSCULAR | Status: AC | PRN
  Administered 2021-02-27: 5 mL

## 2021-02-27 NOTE — Progress Notes (Signed)
Office Visit Note   Patient: Kimberly Roach           Date of Birth: 03-28-2000           MRN: [id number] Visit Date: 02/27/2021 Requested by: Langley Gauss, MD [place],  [place] [id number] PCP: Langley Gauss, MD  Subjective: Chief Complaint  Patient presents with   Right Knee - Pain    HPI: Patient presents for evaluation of right knee pain.  She had near complete lateral meniscectomy for significant radial tear that was not repairable done 04/02/2020.  She was doing reasonably well until 2 weeks ago when she felt some "tearing" in the knee with flexion.  This happened at work.  She states that she bent down and "something ripped".  Before that she had occasional swelling.  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              ROS: All systems reviewed are negative as they relate to the chief complaint within the history of present illness.  Patient denies  fevers or chills.   Assessment & Plan: Visit Diagnoses:  1. Right knee pain, unspecified chronicity     Plan: Impression is right knee pain with no significant joint space narrowing on plain radiographs.  No effusion today.  She is having some episodic pain on that lateral side.  Injection performed today but before she developed significant arthritis I think it would be worth her seeing the sports medicine physician at [facility] who does meniscal transplants.  She will follow-up with me as needed.  Continue with nonweightbearing quad strengthening exercises.  Follow-Up Instructions: No follow-ups on file.   Orders:  Orders Placed This Encounter  Procedures   XR Knee 1-2 Views Right   Ambulatory referral to Orthopedic Surgery   No orders of the defined types were placed in this encounter.     Procedures: Large Joint Inj: R knee on 02/27/2021 11:37 AM Indications: diagnostic evaluation, joint swelling and pain Details: 18 G 1.5 in needle, superolateral  approach  Arthrogram: No  Medications: 5 mL lidocaine 1 %; 40 mg methylPREDNISolone acetate 40 MG/ML; 4 mL bupivacaine 0.25 % Outcome: tolerated well, no immediate complications Procedure, treatment alternatives, risks and benefits explained, specific risks discussed. Consent was given by the patient. Immediately prior to procedure a time out was called to verify the correct patient, procedure, equipment, support staff and site/side marked as required. Patient was prepped and draped in the usual sterile fashion.      Clinical Data: No additional findings.  Objective: Vital Signs: There were no vitals taken for this visit.  Physical Exam:   Constitutional: Patient appears well-developed HEENT:  Head: Normocephalic Eyes:EOM are normal Neck: Normal range of motion Cardiovascular: Normal rate Pulmonary/chest: Effort normal Neurologic: Patient is alert Skin: Skin is warm Psychiatric: Patient has normal mood and affect   Ortho Exam: Ortho exam demonstrates full active and passive range of motion of the right and left knee.  No effusion in that right knee.  Alignment intact.  No groin pain with internal/external rotation of the leg.  Pedal pulses palpable.  Mild lateral joint line tenderness but not much crepitus.  Specialty Comments:  No specialty comments available.  Imaging: XR Knee 1-2 Views Right  Result Date: 02/27/2021 AP lateral radiographs right knee reviewed.  Minimal joint space narrowing in the lateral compartment.  No acute fracture.  No osteophyte formation.  No loose bodies.  Alignment  intact    PMFS History: Patient Active Problem List   Diagnosis Date Noted   Gross hematuria 07/25/2020   Complex tear of lateral meniscus of right knee as current injury    Right knee pain 09/14/2019   Marijuana abuse 12/31/2017   Other specified anxiety disorders 12/24/2017   MDD (major depressive disorder), recurrent episode, moderate ([place]) 12/08/2017   Other constipation  03/28/2016   Vaginal discharge 03/28/2016   Bacterial vaginosis 07/23/2014   Recurrent UTI 07/23/2014   Past Medical History:  Diagnosis Date   Anxiety    Depression    Wears contact lenses     Family History  Problem Relation Age of Onset   Healthy Mother    Anxiety disorder Father    Depression Father    Healthy Father     Past Surgical History:  Procedure Laterality Date   KNEE ARTHROSCOPY Right 04/02/2020   Procedure: RIGHT KNEE ARTHROSCOPY, DEBRIDEMENT;  Surgeon: Meredith Pel, MD;  Location: [facility];  Service: Orthopedics;  Laterality: Right;   KNEE ARTHROSCOPY WITH LATERAL MENISECTOMY Right 04/02/2020   Procedure: KNEE ARTHROSCOPY WITH LATERAL Partial MENISECTOMY;  Surgeon: Meredith Pel, MD;  Location: [facility];  Service: Orthopedics;  Laterality: Right;   TONSILLECTOMY     TONSILLECTOMY AND ADENOIDECTOMY N/A 11/22/2014   Procedure: TONSILLECTOMY AND ADENOIDECTOMY;  Surgeon: Carloyn Manner, MD;  Location: [facility];  Service: ENT;  Laterality: N/A;  ADENOIDS CAUTERIZED NO TISSUE SENT FOR PATHOLOGY   Social History   Occupational History    Comment: full time student  Tobacco Use   Smoking status: Never   Smokeless tobacco: Never  Vaping Use   Vaping Use: Former  Substance and Sexual Activity   Alcohol use: Yes    Alcohol/week: 1.0 standard drink    Types: 1 Glasses of wine per week    Comment: Ocasionally   Drug use: Yes    Frequency: 1.0 times per week    Types: Marijuana   Sexual activity: Yes    Birth control/protection: None

## 2021-07-25 ENCOUNTER — Other Ambulatory Visit: Payer: Self-pay | Admitting: Obstetrics and Gynecology

## 2021-07-25 DIAGNOSIS — R310 Gross hematuria: Secondary | ICD-10-CM

## 2023-04-27 ENCOUNTER — Ambulatory Visit
Admission: EM | Admit: 2023-04-27 | Discharge: 2023-04-27 | Disposition: A | Discharge status: Home / Self Care | Attending: Family Medicine | Admitting: Family Medicine

## 2023-04-27 DIAGNOSIS — R300 Dysuria: Secondary | ICD-10-CM | POA: Diagnosis not present

## 2023-04-27 DIAGNOSIS — N760 Acute vaginitis: Secondary | ICD-10-CM

## 2023-04-27 DIAGNOSIS — N3001 Acute cystitis with hematuria: Secondary | ICD-10-CM | POA: Diagnosis not present

## 2023-04-27 LAB — POCT URINALYSIS DIP (MANUAL ENTRY)
Bilirubin, UA: NEGATIVE
Glucose, UA: NEGATIVE mg/dL
Ketones, POC UA: NEGATIVE mg/dL
Nitrite, UA: POSITIVE — AB
Spec Grav, UA: 1.02
Urobilinogen, UA: 0.2 U/dL
pH, UA: 6.5

## 2023-04-27 LAB — POCT URINE PREGNANCY: Preg Test, Ur: NEGATIVE

## 2023-04-27 MED ORDER — FLUCONAZOLE 150 MG PO TABS: 150.0000 mg | ORAL_TABLET | ORAL | 0 refills | Status: DC

## 2023-04-27 MED ORDER — CEPHALEXIN 500 MG PO CAPS: 500.0000 mg | ORAL_CAPSULE | Freq: Two times a day (BID) | ORAL | 0 refills | Status: DC

## 2023-04-27 NOTE — Discharge Instructions (Signed)
 Please start cephalexin to address an urinary tract infection. Use fluconazole for antibiotic associated yeast infection. Make sure you hydrate very well with plain water and a quantity of 64 ounces of water a day.  Please limit drinks that are considered urinary irritants such as soda, sweet tea, coffee, energy drinks, alcohol.  These can worsen your urinary and genital symptoms but also be the source of them.  I will let you know about your urine culture and vaginal swab results through MyChart to see if we need to prescribe or change your antibiotics based off of those results.

## 2023-04-27 NOTE — ED Provider Notes (Signed)
 [facility]  Note:  This document was prepared using Conservation officer, historic buildings and may include unintentional dictation errors.  MRN: [id number] DOB: 2000-10-23  Subjective:   Kimberly Roach is a 23 y.o. female presenting for 2-day history of acute onset malodorous urine, dysuria, vaginal discharge.  Drinks a lot of energy drinks, cranberry juice.  Tries to hydrate well.  Would like STI and UTI check.  No fever, nausea, vomiting, abdominal or pelvic pain.  Would like a pregnancy test.  No current facility-administered medications for this encounter.  Current Outpatient Medications:    doxycycline (VIBRAMYCIN) 100 MG capsule, Take 1 capsule (100 mg total) by mouth 2 (two) times daily., Disp: 14 capsule, Rfl: 0   doxycycline (VIBRAMYCIN) 100 MG capsule, Take 1 capsule (100 mg total) by mouth 2 (two) times daily., Disp: 14 capsule, Rfl: 0   medroxyPROGESTERone (DEPO-PROVERA) 150 MG/ML injection, Inject 150 mg into the muscle every 3 (three) months., Disp: , Rfl:    metroNIDAZOLE (FLAGYL) 500 MG tablet, Take 1 tablet (500 mg total) by mouth 2 (two) times daily., Disp: 14 tablet, Rfl: 0   metroNIDAZOLE (FLAGYL) 500 MG tablet, Take 1 tablet (500 mg total) by mouth 2 (two) times daily., Disp: 14 tablet, Rfl: 0   ondansetron (ZOFRAN) 4 MG tablet, Take 1 tablet (4 mg total) by mouth 3 (three) times daily as needed for nausea or vomiting., Disp: 20 tablet, Rfl: 0   No Known Allergies  Past Medical History:  Diagnosis Date   Anxiety    Depression    Wears contact lenses      Past Surgical History:  Procedure Laterality Date   KNEE ARTHROSCOPY Right 04/02/2020   Procedure: RIGHT KNEE ARTHROSCOPY, DEBRIDEMENT;  Surgeon: Cammy Copa, MD;  [facility];  Service: Orthopedics;  Laterality: Right;   KNEE ARTHROSCOPY WITH LATERAL MENISECTOMY Right 04/02/2020   Procedure: KNEE ARTHROSCOPY WITH LATERAL Partial MENISECTOMY;  Surgeon: Cammy Copa, MD;  Location:  [facility];  Service: Orthopedics;  Laterality: Right;   TONSILLECTOMY     TONSILLECTOMY AND ADENOIDECTOMY N/A 11/22/2014   Procedure: TONSILLECTOMY AND ADENOIDECTOMY;  Surgeon: Bud Face, MD;  Location: [facility];  Service: ENT;  Laterality: N/A;  ADENOIDS CAUTERIZED NO TISSUE SENT FOR PATHOLOGY    Family History  Problem Relation Age of Onset   Healthy Mother    Anxiety disorder Father    Depression Father    Healthy Father     Social History   Tobacco Use   Smoking status: Never   Smokeless tobacco: Never  Vaping Use   Vaping status: Every Day  Substance Use Topics   Alcohol use: Not Currently    Comment: Ocasionally   Drug use: Yes    Types: Marijuana    ROS   Objective:   Vitals: BP 116/79 (BP Location: Right Arm)   Pulse 81   Temp 99.2 F (37.3 C) (Oral)   Resp 16   LMP 04/11/2023   SpO2 97%   Physical Exam Constitutional:      General: She is not in acute distress.    Appearance: Normal appearance. She is well-developed. She is not ill-appearing, toxic-appearing or diaphoretic.  HENT:     Head: Normocephalic and atraumatic.     Nose: Nose normal.     Mouth/Throat:     Mouth: Mucous membranes are moist.  Eyes:     General: No scleral icterus.  Right eye: No discharge.        Left eye: No discharge.     Extraocular Movements: Extraocular movements intact.     Conjunctiva/sclera: Conjunctivae normal.  Cardiovascular:     Rate and Rhythm: Normal rate.  Pulmonary:     Effort: Pulmonary effort is normal.  Abdominal:     General: Bowel sounds are normal. There is no distension.     Palpations: Abdomen is soft. There is no mass.     Tenderness: There is no abdominal tenderness. There is no right CVA tenderness, left CVA tenderness, guarding or rebound.  Skin:    General: Skin is warm and dry.  Neurological:     General: No focal deficit present.     Mental Status: She is alert and  oriented to person, place, and time.  Psychiatric:        Mood and Affect: Mood normal.        Behavior: Behavior normal.        Thought Content: Thought content normal.        Judgment: Judgment normal.     Results for orders placed or performed during the hospital encounter of 04/27/23 (from the past 24 hours)  POCT urine pregnancy     Status: None   Collection Time: 04/27/23  5:53 PM  Result Value Ref Range   Preg Test, Ur Negative Negative  POCT urinalysis dipstick     Status: Abnormal   Collection Time: 04/27/23  5:54 PM  Result Value Ref Range   Color, UA yellow    Clarity, UA cloudy (A)    Glucose, UA negative mg/dL   Bilirubin, UA negative    Ketones, POC UA negative mg/dL   Spec Grav, UA 8.469    Blood, UA small (A)    pH, UA 6.5    Protein Ur, POC trace (A) mg/dL   Urobilinogen, UA 0.2 E.U./dL   Nitrite, UA Positive (A)    Leukocytes, UA Moderate (2+) (A)     Assessment and Plan :   PDMP not reviewed this encounter.  1. Acute cystitis with hematuria   2. Dysuria   3. Acute vaginitis    Start cephalexin to cover for acute cystitis and fluconazole for acute vaginitis, urine culture vaginal cytology pending.  Recommended aggressive hydration, limiting urinary irritants. Counseled patient on potential for adverse effects with medications prescribed/recommended today, ER and return-to-clinic precautions discussed, patient verbalized understanding.    Wallis Bamberg, [place] 04/27/23 1815

## 2023-04-27 NOTE — ED Triage Notes (Signed)
 Pt c/o foul smelling urine x 2 days-feels she has a UTI-also c/o vaginal d/c x 2-3 days-NAD-steady gait

## 2023-04-28 ENCOUNTER — Telehealth (HOSPITAL_COMMUNITY): Payer: Self-pay

## 2023-04-28 LAB — CERVICOVAGINAL ANCILLARY ONLY
Bacterial Vaginitis (gardnerella): POSITIVE — AB
Candida Glabrata: NEGATIVE
Candida Vaginitis: NEGATIVE
Chlamydia: POSITIVE — AB
Comment: NEGATIVE
Comment: NEGATIVE
Comment: NEGATIVE
Comment: NEGATIVE
Comment: NEGATIVE
Comment: NORMAL
Neisseria Gonorrhea: NEGATIVE
Trichomonas: NEGATIVE

## 2023-04-28 LAB — URINE CULTURE: Culture: NO GROWTH

## 2023-04-28 MED ORDER — DOXYCYCLINE HYCLATE 100 MG PO TABS
100.0000 mg | ORAL_TABLET | Freq: Two times a day (BID) | ORAL | 0 refills | Status: AC
Start: 2023-04-28 — End: 2023-05-05

## 2023-04-28 MED ORDER — METRONIDAZOLE 500 MG PO TABS: 500.0000 mg | ORAL_TABLET | Freq: Two times a day (BID) | ORAL | 0 refills | Status: DC

## 2023-04-28 MED ORDER — METRONIDAZOLE 0.75 % VA GEL: 1.0000 | Freq: Every day | VAGINAL | 0 refills | Status: AC

## 2023-04-28 NOTE — Telephone Encounter (Signed)
 Pt returned call. Requested metrogel. Rx updated.   Verified identity using two identifiers.  Provided positive result.  Reviewed safe sex practices, notifying partners, and refraining from sexual activities for 7 days from time of treatment.  Patient verified understanding, all questions answered.

## 2023-04-28 NOTE — Addendum Note (Signed)
 Addended by: Warren Danes on: 04/28/2023 02:15 PM   Modules accepted: Orders

## 2023-04-28 NOTE — Telephone Encounter (Signed)
 Per protocol, pt requires tx with metronidazole and Doxycycline. Attempted to reach patient x1 on both lines. Unable to LVM. Rx sent to pharmacy on file.

## 2023-04-29 ENCOUNTER — Telehealth: Payer: Self-pay

## 2023-04-29 ENCOUNTER — Telehealth (HOSPITAL_COMMUNITY): Payer: Self-pay

## 2023-04-29 MED ORDER — AZITHROMYCIN 250 MG PO TABS: 1000.0000 mg | ORAL_TABLET | Freq: Once | ORAL | 0 refills | Status: AC

## 2023-04-29 NOTE — Telephone Encounter (Signed)
 Pt called reporting she has been unable to tolerate Doxycycline due to vomiting. Azithromycin sent per protocol.

## 2023-04-29 NOTE — Telephone Encounter (Signed)
 Pt called in regarding reaction to Doxycycline. States she has only taken two pills, after taking them she has been vomiting.

## 2023-05-11 ENCOUNTER — Ambulatory Visit
Admission: EM | Admit: 2023-05-11 | Discharge: 2023-05-11 | Disposition: A | Discharge status: Home / Self Care | Attending: Family Medicine | Admitting: Family Medicine

## 2023-05-11 DIAGNOSIS — N760 Acute vaginitis: Secondary | ICD-10-CM | POA: Diagnosis not present

## 2023-05-11 LAB — POCT URINE PREGNANCY: Preg Test, Ur: NEGATIVE

## 2023-05-11 NOTE — ED Provider Notes (Signed)
 [facility]  Note:  This document was prepared using Conservation officer, historic buildings and may include unintentional dictation errors.  MRN: [id number] DOB: 2000/03/19  Subjective:   Kimberly Roach is a 23 y.o. female presenting for 2-week history of acute onset persistent thick brown vaginal discharge with malodor.  Patient was seen 04/27/2023.  She tested positive for chlamydia and bacterial vaginosis.  Unfortunately she could not tolerate oral doxycycline .  She was also treated with MetroGel  as opposed to oral metronidazole .  Does not opposed to pregnancy testing, is not sure if she is pregnant or not.  No urinary symptoms.  No current facility-administered medications for this encounter.  Current Outpatient Medications:    cephALEXin  (KEFLEX ) 500 MG capsule, Take 1 capsule (500 mg total) by mouth 2 (two) times daily., Disp: 10 capsule, Rfl: 0   fluconazole  (DIFLUCAN ) 150 MG tablet, Take 1 tablet (150 mg total) by mouth once a week., Disp: 2 tablet, Rfl: 0   medroxyPROGESTERone  (DEPO-PROVERA ) 150 MG/ML injection, Inject 150 mg into the muscle every 3 (three) months., Disp: , Rfl:    ondansetron  (ZOFRAN ) 4 MG tablet, Take 1 tablet (4 mg total) by mouth 3 (three) times daily as needed for nausea or vomiting., Disp: 20 tablet, Rfl: 0   No Known Allergies  Past Medical History:  Diagnosis Date   Anxiety    Depression    Wears contact lenses      Past Surgical History:  Procedure Laterality Date   KNEE ARTHROSCOPY Right 04/02/2020   Procedure: RIGHT KNEE ARTHROSCOPY, DEBRIDEMENT;  Surgeon: Jasmine Mesi, MD;  Location: [facility];  Service: Orthopedics;  Laterality: Right;   KNEE ARTHROSCOPY WITH LATERAL MENISECTOMY Right 04/02/2020   Procedure: KNEE ARTHROSCOPY WITH LATERAL Partial MENISECTOMY;  Surgeon: Jasmine Mesi, MD;  Location: [facility];  Service: Orthopedics;  Laterality: Right;   TONSILLECTOMY      TONSILLECTOMY AND ADENOIDECTOMY N/A 11/22/2014   Procedure: TONSILLECTOMY AND ADENOIDECTOMY;  Surgeon: Rogers Clayman, MD;  Location: [facility];  Service: ENT;  Laterality: N/A;  ADENOIDS CAUTERIZED NO TISSUE SENT FOR PATHOLOGY    Family History  Problem Relation Age of Onset   Healthy Mother    Anxiety disorder Father    Depression Father    Healthy Father     Social History   Tobacco Use   Smoking status: Never   Smokeless tobacco: Never  Vaping Use   Vaping status: Every Day  Substance Use Topics   Alcohol use: Not Currently    Comment: Ocasionally   Drug use: Yes    Types: Marijuana    ROS   Objective:   Vitals: BP 122/76 (BP Location: Left Arm)   Pulse 68   Temp 98.4 F (36.9 C) (Oral)   Resp 16   LMP 04/11/2023   SpO2 96%   Physical Exam Constitutional:      General: She is not in acute distress.    Appearance: Normal appearance. She is well-developed. She is not ill-appearing, toxic-appearing or diaphoretic.  HENT:     Head: Normocephalic and atraumatic.     Nose: Nose normal.     Mouth/Throat:     Mouth: Mucous membranes are moist.     Pharynx: Oropharynx is clear.  Eyes:     General: No scleral icterus.       Right eye: No discharge.        Left eye: No discharge.     Extraocular Movements:  Extraocular movements intact.     Conjunctiva/sclera: Conjunctivae normal.  Cardiovascular:     Rate and Rhythm: Normal rate.  Pulmonary:     Effort: Pulmonary effort is normal.  Abdominal:     General: Bowel sounds are normal. There is no distension.     Palpations: Abdomen is soft. There is no mass.     Tenderness: There is abdominal tenderness (L>R; mild) in the suprapubic area. There is no right CVA tenderness, left CVA tenderness, guarding or rebound.  Skin:    General: Skin is warm and dry.  Neurological:     General: No focal deficit present.     Mental Status: She is alert and oriented to person, place, and time.  Psychiatric:         Mood and Affect: Mood normal.        Behavior: Behavior normal.        Thought Content: Thought content normal.        Judgment: Judgment normal.     Results for orders placed or performed during the hospital encounter of 05/11/23 (from the past 24 hours)  POCT urine pregnancy     Status: None   Collection Time: 05/11/23  4:25 PM  Result Value Ref Range   Preg Test, Ur Negative Negative    Assessment and Plan :   PDMP not reviewed this encounter.  1. Acute vaginitis    Low suspicion for an acute gynecologic process, PID given minimal tenderness on abdominal/pelvic exam, hemodynamically stable vital signs.  Recommend treating based off of results.  Patient is in agreement.  Continue supportive care, ibuprofen  or naproxen  for pelvic pain in the meantime.  Counseled patient on potential for adverse effects with medications prescribed/recommended today, ER and return-to-clinic precautions discussed, patient verbalized understanding.    Adolph Hoop, [place] 05/11/23 1645

## 2023-05-11 NOTE — Discharge Instructions (Signed)
 We will let you know about your test results tomorrow and treat you based off of those results. This way you do not take medications you do not need since you just completed 2 rounds of antibiotics. If you have not heard from our results nurse by 12:00pm tomorrow, then call our clinic directly for results review and treatment.

## 2023-05-11 NOTE — ED Triage Notes (Signed)
 Patient presents to the office for brown vaginal discharge with odor. Patient states she had chlamydia 2 weeks ago.

## 2023-05-12 LAB — CERVICOVAGINAL ANCILLARY ONLY
Bacterial Vaginitis (gardnerella): POSITIVE — AB
Candida Glabrata: NEGATIVE
Candida Vaginitis: NEGATIVE
Chlamydia: NEGATIVE
Comment: NEGATIVE
Comment: NEGATIVE
Comment: NEGATIVE
Comment: NEGATIVE
Comment: NEGATIVE
Comment: NORMAL
Neisseria Gonorrhea: NEGATIVE
Trichomonas: NEGATIVE

## 2023-05-13 ENCOUNTER — Telehealth (HOSPITAL_COMMUNITY): Payer: Self-pay

## 2023-05-13 MED ORDER — METRONIDAZOLE 0.75 % VA GEL: 1.0000 | Freq: Every day | VAGINAL | 0 refills | Status: DC

## 2023-05-13 NOTE — Telephone Encounter (Signed)
 Per protocol, pt requires tx with metronidazole. Rx sent to pharmacy on file.

## 2023-05-18 ENCOUNTER — Encounter (HOSPITAL_COMMUNITY): Payer: Self-pay | Admitting: *Deleted

## 2023-05-18 ENCOUNTER — Ambulatory Visit (HOSPITAL_COMMUNITY)
Admission: EM | Admit: 2023-05-18 | Discharge: 2023-05-18 | Disposition: A | Discharge status: Home / Self Care | Attending: Physician Assistant | Admitting: Physician Assistant

## 2023-05-18 DIAGNOSIS — N898 Other specified noninflammatory disorders of vagina: Secondary | ICD-10-CM | POA: Insufficient documentation

## 2023-05-18 DIAGNOSIS — Z3202 Encounter for pregnancy test, result negative: Secondary | ICD-10-CM

## 2023-05-18 DIAGNOSIS — Z113 Encounter for screening for infections with a predominantly sexual mode of transmission: Secondary | ICD-10-CM | POA: Insufficient documentation

## 2023-05-18 LAB — HIV ANTIBODY (ROUTINE TESTING W REFLEX): HIV Screen 4th Generation wRfx: NONREACTIVE

## 2023-05-18 LAB — RPR: RPR Ser Ql: NONREACTIVE

## 2023-05-18 LAB — POCT URINE PREGNANCY: Preg Test, Ur: NEGATIVE

## 2023-05-18 MED ORDER — METRONIDAZOLE 500 MG PO TABS: 500.0000 mg | ORAL_TABLET | Freq: Two times a day (BID) | ORAL | 0 refills | Status: DC

## 2023-05-18 NOTE — ED Triage Notes (Signed)
 Pt states she wants STI testing. She thinks she has BV, she has brown discharge X 4 days. She complains of lower abdominal pain. She would like cyto and blood work today. She is also late for her cycle.

## 2023-05-18 NOTE — ED Provider Notes (Signed)
 [facility]    CSN: [id number] Arrival date & time: 05/18/23  0820      History   Chief Complaint Chief Complaint  Patient presents with   SEXUALLY TRANSMITTED DISEASE    HPI Kimberly Roach is a 23 y.o. female.   Patient presents today with a 3-day history of recurrent vaginal discharge.  She has a history of recurrent BV and has been tested and treated on 04/27/2023 and 05/11/2023.  She reports that her symptoms improved but never completely resolved.  She has been using metronidazole  gel due to GI side effects of oral metronidazole  and wonders if she is not completely treating this condition.  She has no specific concern for STI but is interested in complete STI panel.  She denies any recent antibiotics outside of what was prescribed to treat BV.  She has seen her OB/GYN because of recurrent BV and she has started probiotics.  She is considering initiating boric acid suppositories but has not yet done so.  She denies any changes to personal hygiene products including soaps, detergents, antibiotics, medications.  She will intermittently have some lower abdominal pain but is currently asymptomatic and denies any pelvic pain, abdominal pain, fever, nausea, vomiting, urinary symptoms.  She does not believe that she is pregnant but is open to testing.    Past Medical History:  Diagnosis Date   Anxiety    Depression    Wears contact lenses     Patient Active Problem List   Diagnosis Date Noted   Gross hematuria 07/25/2020   Complex tear of lateral meniscus of right knee as current injury    Right knee pain 09/14/2019   Marijuana abuse 12/31/2017   Other specified anxiety disorders 12/24/2017   MDD (major depressive disorder), recurrent episode, moderate (HCC) 12/08/2017   Other constipation 03/28/2016   Vaginal discharge 03/28/2016   Bacterial vaginosis 07/23/2014   Recurrent UTI 07/23/2014    Past Surgical History:  Procedure Laterality Date   KNEE ARTHROSCOPY Right  04/02/2020   Procedure: RIGHT KNEE ARTHROSCOPY, DEBRIDEMENT;  Surgeon: Jasmine Mesi, MD;  Location: [facility];  Service: Orthopedics;  Laterality: Right;   KNEE ARTHROSCOPY WITH LATERAL MENISECTOMY Right 04/02/2020   Procedure: KNEE ARTHROSCOPY WITH LATERAL Partial MENISECTOMY;  Surgeon: Jasmine Mesi, MD;  Location: [facility];  Service: Orthopedics;  Laterality: Right;   TONSILLECTOMY     TONSILLECTOMY AND ADENOIDECTOMY N/A 11/22/2014   Procedure: TONSILLECTOMY AND ADENOIDECTOMY;  Surgeon: Rogers Clayman, MD;  Location: [facility];  Service: ENT;  Laterality: N/A;  ADENOIDS CAUTERIZED NO TISSUE SENT FOR PATHOLOGY    OB History     Gravida  1   Para      Term      Preterm      AB  1   Living         SAB  1   IAB      Ectopic      Multiple      Live Births               Home Medications    Prior to Admission medications   Medication Sig Start Date End Date Taking? Authorizing Provider  metroNIDAZOLE  (FLAGYL ) 500 MG tablet Take 1 tablet (500 mg total) by mouth 2 (two) times daily. 05/18/23  Yes Lounell Schumacher K, PA-C  ondansetron  (ZOFRAN ) 4 MG tablet Take 1 tablet (4 mg total) by mouth 3 (three) times daily as needed for nausea  or vomiting. 01/17/21   Ethlyn Herd, MD  albuterol  (PROVENTIL ) (2.5 MG/3ML) 0.083% nebulizer solution Take 3 mLs (2.5 mg total) by nebulization every 6 (six) hours as needed for wheezing or shortness of breath. 03/12/18 11/06/18  Burky, Natalie B, NP  ipratropium (ATROVENT ) 0.06 % nasal spray Place 2 sprays into both nostrils 3 (three) times daily as needed for rhinitis. 11/06/18 11/25/18  Bynum Cassis, NP    Family History Family History  Problem Relation Age of Onset   Healthy Mother    Anxiety disorder Father    Depression Father    Healthy Father     Social History Social History   Tobacco Use   Smoking status: Never   Smokeless tobacco: Never  Vaping Use   Vaping  status: Every Day  Substance Use Topics   Alcohol use: Not Currently    Comment: Ocasionally   Drug use: Yes    Types: Marijuana     Allergies   Patient has no known allergies.   Review of Systems Review of Systems  Constitutional:  Negative for activity change, appetite change, fatigue and fever.  Gastrointestinal:  Negative for abdominal pain, diarrhea, nausea and vomiting.  Genitourinary:  Positive for vaginal discharge. Negative for dysuria, frequency, urgency, vaginal bleeding and vaginal pain.  Musculoskeletal:  Negative for arthralgias, back pain and myalgias.     Physical Exam Triage Vital Signs ED Triage Vitals  Encounter Vitals Group     BP 05/18/23 0846 123/85     Systolic BP Percentile --      Diastolic BP Percentile --      Pulse Rate 05/18/23 0846 74     Resp 05/18/23 0846 18     Temp 05/18/23 0846 98.5 F (36.9 C)     Temp Source 05/18/23 0846 Oral     SpO2 05/18/23 0846 98 %     Weight --      Height --      Head Circumference --      Peak Flow --      Pain Score 05/18/23 0845 5     Pain Loc --      Pain Education --      Exclude from Growth Chart --    No data found.  Updated Vital Signs BP 123/85 (BP Location: Right Arm)   Pulse 74   Temp 98.5 F (36.9 C) (Oral)   Resp 18   LMP 04/11/2023 (Exact Date)   SpO2 98%   Visual Acuity Right Eye Distance:   Left Eye Distance:   Bilateral Distance:    Right Eye Near:   Left Eye Near:    Bilateral Near:     Physical Exam Vitals reviewed.  Constitutional:      General: She is awake. She is not in acute distress.    Appearance: Normal appearance. She is well-developed. She is not ill-appearing.     Comments: Very pleasant female appears stated age in no acute distress sitting comfortably in exam room  HENT:     Head: Normocephalic and atraumatic.  Cardiovascular:     Rate and Rhythm: Normal rate and regular rhythm.     Heart sounds: Normal heart sounds, S1 normal and S2 normal. No murmur  heard. Pulmonary:     Effort: Pulmonary effort is normal.     Breath sounds: Normal breath sounds. No wheezing, rhonchi or rales.     Comments: Clear to auscultation bilaterally Abdominal:     General: Bowel sounds are normal.  Palpations: Abdomen is soft.     Tenderness: There is no abdominal tenderness. There is no right CVA tenderness, left CVA tenderness, guarding or rebound.     Comments: Benign abdominal exam.  No tenderness palpation.  Genitourinary:    Comments: Exam deferred Psychiatric:        Behavior: Behavior is cooperative.      UC Treatments / Results  Labs (all labs ordered are listed, but only abnormal results are displayed) Labs Reviewed  HIV ANTIBODY (ROUTINE TESTING W REFLEX)  RPR  POCT URINE PREGNANCY  CERVICOVAGINAL ANCILLARY ONLY    EKG   Radiology No results found.  Procedures Procedures (including critical care time)  Medications Ordered in UC Medications - No data to display  Initial Impression / Assessment and Plan / UC Course  I have reviewed the triage vital signs and the nursing notes.  Pertinent labs & imaging results that were available during my care of the patient were reviewed by me and considered in my medical decision making (see chart for details).     Patient is well-appearing, afebrile, nontoxic, nontachycardic.  Vital signs and physical exam are reassuring with no indication for emergent evaluation or imaging.  Urine pregnancy was negative.  UA was deferred as she denied any urinary symptoms.  STI swab was collected and is pending.  She requested complete STI panel which was obtained today.  Will empirically treat for BV with metronidazole  oral formulation.  We discussed that she should avoid alcohol while on this medication for 3 days after completing course.  We will contact her if we need to arrange additional treatment based on her swab results.  She was encouraged to wear loosefitting cotton underwear and use  hypoallergenic soaps and detergents.  We discussed that if she develops any pelvic pain, abdominal pain, fever, nausea, vomiting she needs to be seen immediately.  Strict return precautions given.  Excuse note provided.  Final Clinical Impressions(s) / UC Diagnoses   Final diagnoses:  Vaginal discharge  Screening examination for STI     Discharge Instructions      We are treating you for bacterial vaginosis.  Please start metronidazole  twice daily for 7 days.  Do not drink any alcohol while on this medication for 3 days after you finish the medicine because it would cause you to vomit.  Your urine pregnancy test was negative.  We will contact you if need to arrange additional treatment based on your testing.  Wear loosefitting cotton underwear and use hypoallergenic soaps and detergents.  Follow-up with your OB/GYN.  If anything changes and you have abdominal pain, pelvic pain, fever, nausea, vomiting, persistent or changing discharge you need to be seen immediately.     ED Prescriptions     Medication Sig Dispense Auth. Provider   metroNIDAZOLE  (FLAGYL ) 500 MG tablet Take 1 tablet (500 mg total) by mouth 2 (two) times daily. 14 tablet Kyarah Enamorado K, PA-C      PDMP not reviewed this encounter.   Budd Cargo, PA-C 05/18/23 1308

## 2023-05-18 NOTE — Discharge Instructions (Signed)
 We are treating you for bacterial vaginosis.  Please start metronidazole  twice daily for 7 days.  Do not drink any alcohol while on this medication for 3 days after you finish the medicine because it would cause you to vomit.  Your urine pregnancy test was negative.  We will contact you if need to arrange additional treatment based on your testing.  Wear loosefitting cotton underwear and use hypoallergenic soaps and detergents.  Follow-up with your OB/GYN.  If anything changes and you have abdominal pain, pelvic pain, fever, nausea, vomiting, persistent or changing discharge you need to be seen immediately.

## 2023-05-19 ENCOUNTER — Telehealth (HOSPITAL_COMMUNITY): Payer: Self-pay | Admitting: *Deleted

## 2023-05-19 DIAGNOSIS — A749 Chlamydial infection, unspecified: Secondary | ICD-10-CM

## 2023-05-19 LAB — CERVICOVAGINAL ANCILLARY ONLY
Bacterial Vaginitis (gardnerella): POSITIVE — AB
Candida Glabrata: NEGATIVE
Candida Vaginitis: NEGATIVE
Chlamydia: POSITIVE — AB
Comment: NEGATIVE
Comment: NEGATIVE
Comment: NEGATIVE
Comment: NEGATIVE
Comment: NEGATIVE
Comment: NORMAL
Neisseria Gonorrhea: NEGATIVE
Trichomonas: NEGATIVE

## 2023-05-19 MED ORDER — DOXYCYCLINE HYCLATE 100 MG PO CAPS
100.0000 mg | ORAL_CAPSULE | Freq: Two times a day (BID) | ORAL | 0 refills | Status: AC
Start: 2023-05-19 — End: 2023-05-26

## 2023-05-19 NOTE — Telephone Encounter (Signed)
 Doxycycline  100 mg PO BID x 7 days sent in per protocol.  Please inform patient that she should notify recent sexual partners about test results so that they can also be tested and treated appropriately.  She should refrain from sexual activity until test results are negative or she is completed appropriate medication regimen.  She should use condoms or another appropriate barrier method to help prevent sexually transmitted diseases.

## 2023-05-19 NOTE — Telephone Encounter (Signed)
 Pt seen test results on my chart for cyto and wants to be treated for chlamydia. [employer] is Proofreader

## 2023-05-22 NOTE — Telephone Encounter (Signed)
Called pt she is aware

## 2023-06-04 ENCOUNTER — Encounter: Payer: Self-pay | Admitting: Obstetrics & Gynecology

## 2023-06-04 ENCOUNTER — Other Ambulatory Visit (HOSPITAL_COMMUNITY)
Admission: RE | Admit: 2023-06-04 | Discharge: 2023-06-04 | Disposition: A | Discharge status: Home / Self Care | Source: Ambulatory Visit | Attending: Obstetrics & Gynecology | Admitting: Obstetrics & Gynecology

## 2023-06-04 ENCOUNTER — Ambulatory Visit: Admitting: Obstetrics & Gynecology

## 2023-06-04 VITALS — BP 123/84 | HR 75 | Ht 62.0 in | Wt 124.0 lb

## 2023-06-04 DIAGNOSIS — Z113 Encounter for screening for infections with a predominantly sexual mode of transmission: Secondary | ICD-10-CM

## 2023-06-04 DIAGNOSIS — N898 Other specified noninflammatory disorders of vagina: Secondary | ICD-10-CM | POA: Insufficient documentation

## 2023-06-04 DIAGNOSIS — Z01419 Encounter for gynecological examination (general) (routine) without abnormal findings: Secondary | ICD-10-CM

## 2023-06-04 DIAGNOSIS — Z30013 Encounter for initial prescription of injectable contraceptive: Secondary | ICD-10-CM

## 2023-06-04 DIAGNOSIS — Z3202 Encounter for pregnancy test, result negative: Secondary | ICD-10-CM | POA: Diagnosis not present

## 2023-06-04 DIAGNOSIS — Z124 Encounter for screening for malignant neoplasm of cervix: Secondary | ICD-10-CM

## 2023-06-04 LAB — POCT URINE PREGNANCY: Preg Test, Ur: NEGATIVE

## 2023-06-04 MED ORDER — MEDROXYPROGESTERONE ACETATE 150 MG/ML IM SUSP: 150.0000 mg | INTRAMUSCULAR | 4 refills | Status: DC

## 2023-06-04 MED ORDER — MEDROXYPROGESTERONE ACETATE 150 MG/ML IM SUSP
150.0000 mg | Freq: Once | INTRAMUSCULAR | Status: AC
  Administered 2023-06-04: 150 mg via INTRAMUSCULAR
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
## 2023-06-04 NOTE — Progress Notes (Unsigned)
 Pt is new to office interested in [facility] options - possible Depo. Pt has not yet had pap smear, would like today.   Depo given today in RUOQ, pt tolerated well. Pt made aware of Rx sent to pharmacy and bring to each appt.  Administrations This Visit     medroxyPROGESTERone  (DEPO-PROVERA ) injection 150 mg     Admin Date 06/04/2023 Action Given Dose 150 mg Route Intramuscular Documented By Donnella Gain, CMA

## 2023-06-05 LAB — HEPATITIS B SURFACE ANTIGEN: Hepatitis B Surface Ag: NEGATIVE

## 2023-06-05 LAB — CERVICOVAGINAL ANCILLARY ONLY
Bacterial Vaginitis (gardnerella): POSITIVE — AB
Candida Glabrata: NEGATIVE
Candida Vaginitis: NEGATIVE
Comment: NEGATIVE
Comment: NEGATIVE
Comment: NEGATIVE

## 2023-06-05 LAB — HEPATITIS C ANTIBODY: Hep C Virus Ab: NONREACTIVE

## 2023-06-08 ENCOUNTER — Ambulatory Visit: Payer: Self-pay | Admitting: Obstetrics & Gynecology

## 2023-06-08 DIAGNOSIS — Z113 Encounter for screening for infections with a predominantly sexual mode of transmission: Secondary | ICD-10-CM

## 2023-06-08 LAB — CYTOLOGY - PAP
Chlamydia: POSITIVE — AB
Comment: NEGATIVE
Comment: NORMAL
Neisseria Gonorrhea: NEGATIVE

## 2023-06-08 MED ORDER — AZITHROMYCIN 250 MG PO TABS: 1000.0000 mg | ORAL_TABLET | Freq: Once | ORAL | 0 refills | Status: AC

## 2023-06-08 NOTE — Progress Notes (Signed)
 I sent rx for chlamydia. She will need colposcopy

## 2023-06-09 ENCOUNTER — Other Ambulatory Visit: Payer: Self-pay

## 2023-06-09 DIAGNOSIS — B9689 Other specified bacterial agents as the cause of diseases classified elsewhere: Secondary | ICD-10-CM

## 2023-06-09 DIAGNOSIS — A749 Chlamydial infection, unspecified: Secondary | ICD-10-CM

## 2023-06-09 MED ORDER — METRONIDAZOLE 500 MG PO TABS: 500.0000 mg | ORAL_TABLET | Freq: Two times a day (BID) | ORAL | 0 refills | Status: DC

## 2023-06-09 MED ORDER — DOXYCYCLINE HYCLATE 100 MG PO CAPS
100.0000 mg | ORAL_CAPSULE | Freq: Two times a day (BID) | ORAL | 0 refills | Status: DC
Start: 2023-06-09 — End: 2023-06-16

## 2023-06-16 ENCOUNTER — Inpatient Hospital Stay
Admission: RE | Admit: 2023-06-16 | Discharge: 2023-06-16 | Disposition: A | Discharge status: Home / Self Care | Payer: Self-pay | Source: Ambulatory Visit

## 2023-06-16 ENCOUNTER — Other Ambulatory Visit: Payer: Self-pay

## 2023-06-16 ENCOUNTER — Ambulatory Visit
Admission: EM | Admit: 2023-06-16 | Discharge: 2023-06-16 | Disposition: A | Discharge status: Home / Self Care | Attending: Physician Assistant | Admitting: Physician Assistant

## 2023-06-16 DIAGNOSIS — R112 Nausea with vomiting, unspecified: Secondary | ICD-10-CM

## 2023-06-16 DIAGNOSIS — J069 Acute upper respiratory infection, unspecified: Secondary | ICD-10-CM | POA: Diagnosis not present

## 2023-06-16 LAB — POC COVID19/FLU A&B COMBO
Covid Antigen, POC: NEGATIVE
Influenza A Antigen, POC: NEGATIVE
Influenza B Antigen, POC: NEGATIVE

## 2023-06-16 LAB — POCT RAPID STREP A (OFFICE): Rapid Strep A Screen: NEGATIVE

## 2023-06-16 MED ORDER — ONDANSETRON HCL 4 MG/2ML IJ SOLN
4.0000 mg | Freq: Once | INTRAMUSCULAR | Status: AC
Start: 2023-06-16 — End: 2023-06-16
  Administered 2023-06-16: 4 mg via INTRAMUSCULAR

## 2023-06-16 MED ORDER — ONDANSETRON 4 MG PO TBDP: 4.0000 mg | ORAL_TABLET | Freq: Three times a day (TID) | ORAL | 0 refills | Status: DC | PRN

## 2023-06-16 NOTE — ED Triage Notes (Signed)
 Pt presents with complaints of sore throat, headaches, eye soreness, and fevers x 4 days. Pt states she vomited this morning. OTC Tylenol , nasal spray, and Mucinex taken with improvement in symptoms. Last known fever was three days ago. Pt currently rates her overall pain a 6/10. Last dose of Tylenol  was 0800 this morning.

## 2023-06-16 NOTE — ED Provider Notes (Signed)
 [facility]    CSN: [id number] Arrival date & time: 06/16/23  1323      History   Chief Complaint Chief Complaint  Patient presents with   Sore Throat    HPI Kimberly Roach is a 23 y.o. female.   HPI   Pt reports she started to have nasal congestion and sore throat which started about 4 days ago  She then developed fever, headaches and eye soreness  Interventions: Mucinex, tylenol , nasal spray She reports single episode of vomiting and continued nausea throughout the day   She denies recent sick contacts or travel      Past Medical History:  Diagnosis Date   Anxiety    Depression    Sickle cell anemia (HCC)    Wears contact lenses     Patient Active Problem List   Diagnosis Date Noted   Gross hematuria 07/25/2020   Complex tear of lateral meniscus of right knee as current injury    Right knee pain 09/14/2019   Marijuana abuse 12/31/2017   Other specified anxiety disorders 12/24/2017   MDD (major depressive disorder), recurrent episode, moderate (HCC) 12/08/2017   Other constipation 03/28/2016   Vaginal discharge 03/28/2016   Bacterial vaginosis 07/23/2014   Recurrent UTI 07/23/2014    Past Surgical History:  Procedure Laterality Date   KNEE ARTHROSCOPY Right 04/02/2020   Procedure: RIGHT KNEE ARTHROSCOPY, DEBRIDEMENT;  Surgeon: Jasmine Mesi, MD;  Location: [facility];  Service: Orthopedics;  Laterality: Right;   KNEE ARTHROSCOPY WITH LATERAL MENISECTOMY Right 04/02/2020   Procedure: KNEE ARTHROSCOPY WITH LATERAL Partial MENISECTOMY;  Surgeon: Jasmine Mesi, MD;  Location: [facility];  Service: Orthopedics;  Laterality: Right;   TONSILLECTOMY     TONSILLECTOMY AND ADENOIDECTOMY N/A 11/22/2014   Procedure: TONSILLECTOMY AND ADENOIDECTOMY;  Surgeon: Rogers Clayman, MD;  Location: [facility];  Service: ENT;  Laterality: N/A;  ADENOIDS CAUTERIZED NO TISSUE SENT FOR PATHOLOGY    OB History      Gravida  1   Para      Term      Preterm      AB  1   Living         SAB  1   IAB      Ectopic      Multiple      Live Births               Home Medications    Prior to Admission medications   Medication Sig Start Date End Date Taking? Authorizing Provider  ondansetron  (ZOFRAN -ODT) 4 MG disintegrating tablet Take 1 tablet (4 mg total) by mouth every 8 (eight) hours as needed for nausea or vomiting. 06/16/23  Yes Judia Arnott E, PA-C  medroxyPROGESTERone  (DEPO-PROVERA ) 150 MG/ML injection Inject 1 mL (150 mg total) into the muscle every 3 (three) months. 06/04/23   Tresia Fruit, MD  albuterol  (PROVENTIL ) (2.5 MG/3ML) 0.083% nebulizer solution Take 3 mLs (2.5 mg total) by nebulization every 6 (six) hours as needed for wheezing or shortness of breath. 03/12/18 11/06/18  Burky, Natalie B, NP  ipratropium (ATROVENT ) 0.06 % nasal spray Place 2 sprays into both nostrils 3 (three) times daily as needed for rhinitis. 11/06/18 11/25/18  Bynum Cassis, NP    Family History Family History  Problem Relation Age of Onset   Healthy Mother    Anxiety disorder Father    Depression Father    Healthy Father     Social  History Social History   Tobacco Use   Smoking status: Never   Smokeless tobacco: Never  Vaping Use   Vaping status: Former  Substance Use Topics   Alcohol use: Yes    Comment: Ocasionally   Drug use: Yes    Types: Marijuana     Allergies   Patient has no known allergies.   Review of Systems Review of Systems  Constitutional:  Positive for chills, fatigue and fever (tmax 101).  HENT:  Positive for congestion, ear pain, postnasal drip, rhinorrhea, sinus pressure, sinus pain and sore throat.   Eyes:  Positive for pain.  Respiratory:  Positive for cough. Negative for chest tightness, shortness of breath and wheezing.   Gastrointestinal:  Positive for nausea and vomiting (once this AM). Negative for diarrhea.  Musculoskeletal:  Negative for  myalgias.  Skin:  Negative for rash.  Neurological:  Positive for headaches.     Physical Exam Triage Vital Signs ED Triage Vitals  Encounter Vitals Group     BP 06/16/23 1354 106/75     Systolic BP Percentile --      Diastolic BP Percentile --      Pulse Rate 06/16/23 1354 79     Resp 06/16/23 1354 17     Temp 06/16/23 1354 98.4 F (36.9 C)     Temp Source 06/16/23 1354 Oral     SpO2 06/16/23 1354 98 %     Weight 06/16/23 1356 123 lb 14.4 oz (56.2 kg)     Height 06/16/23 1356 5\' 2"  (1.575 m)     Head Circumference --      Peak Flow --      Pain Score 06/16/23 1356 6     Pain Loc --      Pain Education --      Exclude from Growth Chart --    No data found.  Updated Vital Signs BP 106/75 (BP Location: Right Arm)   Pulse 79   Temp 98.4 F (36.9 C) (Oral)   Resp 17   Ht 5\' 2"  (1.575 m)   Wt 123 lb 14.4 oz (56.2 kg)   LMP 05/27/2023 (Exact Date)   SpO2 98%   BMI 22.66 kg/m   Visual Acuity Right Eye Distance:   Left Eye Distance:   Bilateral Distance:    Right Eye Near:   Left Eye Near:    Bilateral Near:     Physical Exam Vitals reviewed.  Constitutional:      General: She is awake. She is not in acute distress.    Appearance: Normal appearance. She is well-developed and well-groomed. She is not ill-appearing or toxic-appearing.  HENT:     Head: Normocephalic and atraumatic.     Right Ear: Hearing and ear canal normal. There is impacted cerumen.     Left Ear: Hearing, tympanic membrane and ear canal normal.     Mouth/Throat:     Lips: Pink.     Mouth: Mucous membranes are moist.     Pharynx: Oropharynx is clear. Uvula midline. Posterior oropharyngeal erythema and postnasal drip present. No pharyngeal swelling, oropharyngeal exudate or uvula swelling.     Tonsils: No tonsillar exudate or tonsillar abscesses. 0 on the right. 0 on the left.  Cardiovascular:     Rate and Rhythm: Normal rate and regular rhythm.     Pulses: Normal pulses.          Radial  pulses are 2+ on the right side and 2+ on the left side.  Heart sounds: Normal heart sounds. No murmur heard.    No friction rub. No gallop.  Pulmonary:     Effort: Pulmonary effort is normal.     Breath sounds: Normal breath sounds. No decreased air movement. No decreased breath sounds, wheezing, rhonchi or rales.  Musculoskeletal:     Cervical back: Normal range of motion and neck supple.  Lymphadenopathy:     Head:     Right side of head: No submental, submandibular or preauricular adenopathy.     Left side of head: No submental, submandibular or preauricular adenopathy.     Cervical:     Right cervical: No superficial cervical adenopathy.    Left cervical: No superficial cervical adenopathy.     Upper Body:     Right upper body: No supraclavicular adenopathy.     Left upper body: No supraclavicular adenopathy.  Skin:    General: Skin is warm and dry.  Neurological:     General: No focal deficit present.     Mental Status: She is alert and oriented to person, place, and time.  Psychiatric:        Mood and Affect: Mood normal.        Behavior: Behavior normal. Behavior is cooperative.      UC Treatments / Results  Labs (all labs ordered are listed, but only abnormal results are displayed) Labs Reviewed  POC COVID19/FLU A&B COMBO - Normal  POCT RAPID STREP A (OFFICE) - Normal    EKG   Radiology No results found.  Procedures Procedures (including critical care time)  Medications Ordered in UC Medications  ondansetron  (ZOFRAN ) injection 4 mg (4 mg Intramuscular Given 06/16/23 1453)    Initial Impression / Assessment and Plan / UC Course  I have reviewed the triage vital signs and the nursing notes.  Pertinent labs & imaging results that were available during my care of the patient were reviewed by me and considered in my medical decision making (see chart for details).      Final Clinical Impressions(s) / UC Diagnoses   Final diagnoses:  Upper  respiratory tract infection, unspecified type  Nausea and vomiting, unspecified vomiting type   Patient presents today with concerns for nasal congestion, sore throat, fever and headache as well as eye soreness that started about 4 days ago.  She reports that her symptoms have not improved despite Mucinex, Tylenol  and nasal spray.  She also states that she had single episode of vomiting and has continued nausea today.  Physical exam is overall reassuring as are vitals.  At this time suspect likely viral upper respiratory tract infection given systemic symptoms.  Will provide Zofran  injection per patient preference.  Will also send in Zofran  ODT to assist with any persistent nausea.  Recommend over-the-counter medications as needed for further symptomatic relief.  ED and return precautions reviewed and provided in after visit summary.  Follow-up as needed.    Discharge Instructions      Your rapid flu, COVID, strep tests were negative. You were seen today for concerns of nasal congestion, sinus pressure, headaches, nausea and fever.   Based on your described symptoms and the duration of symptoms it is likely that you have a viral upper respiratory infection (often called a "cold")  Symptoms can last for 3-10 days with lingering cough and intermittent symptoms potentially  lasting several  weeks after that.  The goal of treatment at this time is to reduce your symptoms and discomfort   You can use the  following medications and measures to help yourself feel better until your body fights this off: DayQuil/NyQuil, TheraFlu, Alka-Seltzer  (these medications typically have the same active ingredients in them so you can choose whichever one you prefer and take consistently during the day and night according to the manufactures instructions.) Flonase A daily antihistamine such as Zyrtec , Claritin, Allegra per your preference.  Please choose 1 and take consistently. Increased fluids.  It is recommended  that you take in at least 64 ounces of water per day when you are not sick so it is important to increase this when you are sick and your body may be running fever. Rest Cough drops Chloraseptic throat spray to help with sore throat Nasal saline spray or nasal flushes to help with congestion and runny nose  We have provided you with a shot of a medication called Zofran  4 mg.  This is a medication that helps with nausea and vomiting.  I have also sent you in a prescription of this for you to take at home as needed. You can take this as needed up to every 8 hours.  Please be advised that the most common side effect of this medication is constipation.  If you start to develop the symptoms I recommend taking a few doses of a stool softener and increasing your fiber.  If needed you can also take a few doses of MiraLAX  until you have regular bowel movements again.   If your symptoms seem like they are getting worse over the next 5 to 7 days or not improving you can always follow-up here in urgent care or go to your primary care provider for further management. Go to the ER if you begin to have more serious symptoms such as shortness of breath, trouble breathing, loss of consciousness, swelling around the eyes, high fever, severe lasting headaches, vision changes or neck pain/stiffness.     ED Prescriptions     Medication Sig Dispense Auth. Provider   ondansetron  (ZOFRAN -ODT) 4 MG disintegrating tablet Take 1 tablet (4 mg total) by mouth every 8 (eight) hours as needed for nausea or vomiting. 20 tablet Helene Bernstein E, PA-C      PDMP not reviewed this encounter.   Machael Raine, Pearla Bottom, PA-C 06/16/23 1459

## 2023-06-16 NOTE — Discharge Instructions (Addendum)
 Your rapid flu, COVID, strep tests were negative. You were seen today for concerns of nasal congestion, sinus pressure, headaches, nausea and fever.   Based on your described symptoms and the duration of symptoms it is likely that you have a viral upper respiratory infection (often called a "cold")  Symptoms can last for 3-10 days with lingering cough and intermittent symptoms potentially  lasting several  weeks after that.  The goal of treatment at this time is to reduce your symptoms and discomfort   You can use the following medications and measures to help yourself feel better until your body fights this off: DayQuil/NyQuil, TheraFlu, Alka-Seltzer  (these medications typically have the same active ingredients in them so you can choose whichever one you prefer and take consistently during the day and night according to the manufactures instructions.) Flonase A daily antihistamine such as Zyrtec , Claritin, Allegra per your preference.  Please choose 1 and take consistently. Increased fluids.  It is recommended that you take in at least 64 ounces of water per day when you are not sick so it is important to increase this when you are sick and your body may be running fever. Rest Cough drops Chloraseptic throat spray to help with sore throat Nasal saline spray or nasal flushes to help with congestion and runny nose  We have provided you with a shot of a medication called Zofran  4 mg.  This is a medication that helps with nausea and vomiting.  I have also sent you in a prescription of this for you to take at home as needed. You can take this as needed up to every 8 hours.  Please be advised that the most common side effect of this medication is constipation.  If you start to develop the symptoms I recommend taking a few doses of a stool softener and increasing your fiber.  If needed you can also take a few doses of MiraLAX  until you have regular bowel movements again.   If your symptoms seem like  they are getting worse over the next 5 to 7 days or not improving you can always follow-up here in urgent care or go to your primary care provider for further management. Go to the ER if you begin to have more serious symptoms such as shortness of breath, trouble breathing, loss of consciousness, swelling around the eyes, high fever, severe lasting headaches, vision changes or neck pain/stiffness.

## 2023-06-18 ENCOUNTER — Other Ambulatory Visit (HOSPITAL_COMMUNITY)
Admission: RE | Admit: 2023-06-18 | Discharge: 2023-06-18 | Disposition: A | Discharge status: Home / Self Care | Source: Ambulatory Visit | Attending: Obstetrics and Gynecology | Admitting: Obstetrics and Gynecology

## 2023-06-18 ENCOUNTER — Ambulatory Visit

## 2023-06-18 VITALS — BP 113/73 | HR 70

## 2023-06-18 DIAGNOSIS — Z113 Encounter for screening for infections with a predominantly sexual mode of transmission: Secondary | ICD-10-CM | POA: Diagnosis present

## 2023-06-18 NOTE — Progress Notes (Signed)
 SUBJECTIVE:  23 y.o. female who desires a STI screen. Presents for TOC.  Denies abnormal vaginal discharge, bleeding or significant pelvic pain. No UTI symptoms. Denies history of known exposure to STD.  Patient's last menstrual period was 05/27/2023 (exact date).  OBJECTIVE:  She appears well.   ASSESSMENT:  STI Screen   PLAN:  Pt offered STI blood screening-not indicated GC, chlamydia, and trichomonas probe sent to lab.  Treatment: To be determined once lab results are received.  Pt follow up as needed.

## 2023-06-19 LAB — CERVICOVAGINAL ANCILLARY ONLY
Bacterial Vaginitis (gardnerella): NEGATIVE
Candida Glabrata: NEGATIVE
Candida Vaginitis: NEGATIVE
Chlamydia: NEGATIVE
Comment: NEGATIVE
Comment: NEGATIVE
Comment: NEGATIVE
Comment: NEGATIVE
Comment: NEGATIVE
Comment: NORMAL
Neisseria Gonorrhea: NEGATIVE
Trichomonas: NEGATIVE

## 2023-06-22 ENCOUNTER — Ambulatory Visit: Payer: Self-pay | Admitting: Obstetrics and Gynecology

## 2023-06-25 ENCOUNTER — Ambulatory Visit (INDEPENDENT_AMBULATORY_CARE_PROVIDER_SITE_OTHER): Admitting: Obstetrics & Gynecology

## 2023-06-25 ENCOUNTER — Encounter: Payer: Self-pay | Admitting: Obstetrics & Gynecology

## 2023-06-25 ENCOUNTER — Other Ambulatory Visit (HOSPITAL_COMMUNITY)
Admission: RE | Admit: 2023-06-25 | Discharge: 2023-06-25 | Disposition: A | Discharge status: Home / Self Care | Source: Ambulatory Visit | Attending: Obstetrics & Gynecology | Admitting: Obstetrics & Gynecology

## 2023-06-25 VITALS — BP 110/73 | HR 69 | Ht 61.0 in | Wt 125.0 lb

## 2023-06-25 DIAGNOSIS — Z3202 Encounter for pregnancy test, result negative: Secondary | ICD-10-CM | POA: Diagnosis not present

## 2023-06-25 DIAGNOSIS — R87612 Low grade squamous intraepithelial lesion on cytologic smear of cervix (LGSIL): Secondary | ICD-10-CM | POA: Insufficient documentation

## 2023-06-25 LAB — POCT URINE PREGNANCY: Preg Test, Ur: NEGATIVE

## 2023-06-25 NOTE — Progress Notes (Signed)
 23 y.o. GYN presents for COLPO. 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

## 2023-06-25 NOTE — Progress Notes (Signed)
 Patient ID: Kimberly Roach, female   DOB: Jan 07, 2001, 23 y.o.   MRN: [id number]  Chief Complaint  Patient presents with   Colposcopy    HPI Kimberly Roach is a 23 y.o. female.  [id number] Patient's last menstrual period was 05/27/2023 (exact date).  HPI  Indications: Pap smear on May 2025 showed: low-grade squamous intraepithelial neoplasia (LGSIL - encompassing HPV,mild dysplasia,CIN I). Previous colposcopy: no. Prior cervical treatment: no treatment.  Past Medical History:  Diagnosis Date   Anxiety    Depression    Sickle cell anemia (HCC)    Wears contact lenses     Past Surgical History:  Procedure Laterality Date   KNEE ARTHROSCOPY Right 04/02/2020   Procedure: RIGHT KNEE ARTHROSCOPY, DEBRIDEMENT;  Surgeon: Jasmine Mesi, MD;  Location: [facility];  Service: Orthopedics;  Laterality: Right;   KNEE ARTHROSCOPY WITH LATERAL MENISECTOMY Right 04/02/2020   Procedure: KNEE ARTHROSCOPY WITH LATERAL Partial MENISECTOMY;  Surgeon: Jasmine Mesi, MD;  Location: [facility];  Service: Orthopedics;  Laterality: Right;   TONSILLECTOMY     TONSILLECTOMY AND ADENOIDECTOMY N/A 11/22/2014   Procedure: TONSILLECTOMY AND ADENOIDECTOMY;  Surgeon: Rogers Clayman, MD;  Location: [facility];  Service: ENT;  Laterality: N/A;  ADENOIDS CAUTERIZED NO TISSUE SENT FOR PATHOLOGY    Family History  Problem Relation Age of Onset   Healthy Mother    Anxiety disorder Father    Depression Father    Healthy Father     Social History Social History   Tobacco Use   Smoking status: Never   Smokeless tobacco: Never  Vaping Use   Vaping status: Former  Substance Use Topics   Alcohol use: Yes    Comment: Ocasionally   Drug use: Yes    Types: Marijuana    No Known Allergies  Current Outpatient Medications  Medication Sig Dispense Refill   medroxyPROGESTERone  (DEPO-PROVERA ) 150 MG/ML injection Inject 1 mL (150 mg total) into the muscle every 3  (three) months. 1 mL 4   ondansetron  (ZOFRAN -ODT) 4 MG disintegrating tablet Take 1 tablet (4 mg total) by mouth every 8 (eight) hours as needed for nausea or vomiting. 20 tablet 0   No current facility-administered medications for this visit.    Review of Systems Review of Systems  All other systems reviewed and are negative.   Blood pressure 110/73, pulse 69, height 5\' 1"  (1.549 m), weight 125 lb (56.7 kg), last menstrual period 05/27/2023.  Physical Exam Physical Exam Vitals and nursing note reviewed. Exam conducted with a chaperone present.  Constitutional:      Appearance: Normal appearance.  Genitourinary:    General: Normal vulva.     Exam position: Lithotomy position.     Vagina: Normal.     Cervix: Normal.  Neurological:     Mental Status: She is alert.  Psychiatric:        Mood and Affect: Mood normal.        Behavior: Behavior normal.   Patient given informed consent, signed copy in the chart, time out was performed.  Placed in lithotomy position. Cervix viewed with speculum and colposcope after application of acetic acid.   Colposcopy adequate?  yes Acetowhite lesions?no Punctation?no Mosaicism?  no Abnormal vasculature?  no Biopsies?12:00 ECC?yes  COMMENTS: Patient was given post procedure instructions. Data Reviewed pap result  Assessment    Procedure Details  The risks and benefits of the procedure and Written informed consent obtained.  Speculum placed in vagina and excellent visualization of  cervix achieved, cervix swabbed x 3 with acetic acid solution. Complications: none.     Plan    Specimens labelled and sent to Pathology. Anticipate LSIL and repeat pap in 12 months      Onnie Bilis 06/25/2023, 2:08 PM

## 2023-06-29 LAB — SURGICAL PATHOLOGY

## 2023-06-30 ENCOUNTER — Encounter: Payer: Self-pay | Admitting: Obstetrics & Gynecology

## 2023-06-30 ENCOUNTER — Other Ambulatory Visit: Payer: Self-pay

## 2023-06-30 MED ORDER — METRONIDAZOLE 500 MG PO TABS: 500.0000 mg | ORAL_TABLET | Freq: Two times a day (BID) | ORAL | 0 refills | Status: DC

## 2023-06-30 NOTE — Progress Notes (Signed)
 Flagyl  rx sent per protocol for BV symptoms

## 2023-07-07 ENCOUNTER — Ambulatory Visit
Admission: EM | Admit: 2023-07-07 | Discharge: 2023-07-07 | Disposition: A | Discharge status: Home / Self Care | Attending: Nurse Practitioner | Admitting: Nurse Practitioner

## 2023-07-07 DIAGNOSIS — Z113 Encounter for screening for infections with a predominantly sexual mode of transmission: Secondary | ICD-10-CM | POA: Diagnosis not present

## 2023-07-07 DIAGNOSIS — N898 Other specified noninflammatory disorders of vagina: Secondary | ICD-10-CM | POA: Insufficient documentation

## 2023-07-07 NOTE — Discharge Instructions (Addendum)
 Cytology results and HIV/syphilis test results are pending.  You will be contacted if the pending test results are positive.  You will also have access to your results via MyChart. If your test results are positive, please notify all partners. Your treatment is indicated, you will need to refrain from sexual intercourse for an additional 7 days after completing treatment. Increase condom use to seek sexual encounter. Follow-up as needed.

## 2023-07-07 NOTE — ED Triage Notes (Signed)
 Pt presents to UC for std testing. Pt reports brown vaginal discharge since yesterday.

## 2023-07-07 NOTE — ED Provider Notes (Signed)
 [facility]    CSN: [id number] Arrival date & time: 07/07/23  0904      History   Chief Complaint No chief complaint on file.   HPI Kimberly Roach is a 23 y.o. female.   The history is provided by the patient.   Patient presents for STI testing.  Patient also endorses brown vaginal discharge.  She denies vaginal odor, vaginal itching, pelvic pain, abdominal pain, urinary frequency, urgency, hesitancy, dysuria, or hematuria.  Patient reports 2 female partners in the past 90 days.  She does have prior history of STI/STD.  States that she currently is on Depo which she restarted again in May.  Last menstrual cycle: 06/28/2023.  She is also requesting testing for HIV and syphilis.  Past Medical History:  Diagnosis Date   Anxiety    Depression    Sickle cell anemia (HCC)    Wears contact lenses     Patient Active Problem List   Diagnosis Date Noted   Gross hematuria 07/25/2020   Complex tear of lateral meniscus of right knee as current injury    Right knee pain 09/14/2019   Marijuana abuse 12/31/2017   Other specified anxiety disorders 12/24/2017   MDD (major depressive disorder), recurrent episode, moderate (HCC) 12/08/2017   Other constipation 03/28/2016   Vaginal discharge 03/28/2016   Bacterial vaginosis 07/23/2014   Recurrent UTI 07/23/2014    Past Surgical History:  Procedure Laterality Date   KNEE ARTHROSCOPY Right 04/02/2020   Procedure: RIGHT KNEE ARTHROSCOPY, DEBRIDEMENT;  Surgeon: Jasmine Mesi, MD;  Location:  [facility]ERY [facility];  Service: Orthopedics;  Laterality: Right;   KNEE ARTHROSCOPY WITH LATERAL MENISECTOMY Right 04/02/2020   Procedure: KNEE ARTHROSCOPY WITH LATERAL Partial MENISECTOMY;  Surgeon: Jasmine Mesi, MD;  Location:  SURGERY CENTER;  Service: Orthopedics;  Laterality: Right;   TONSILLECTOMY     TONSILLECTOMY AND ADENOIDECTOMY N/A 11/22/2014   Procedure: TONSILLECTOMY AND ADENOIDECTOMY;  Surgeon: Rogers Clayman, MD;  Location: [facility];  Service: ENT;  Laterality: N/A;  ADENOIDS CAUTERIZED NO TISSUE SENT FOR PATHOLOGY    OB History     Gravida  1   Para      Term      Preterm      AB  1   Living         SAB  1   IAB      Ectopic      Multiple      Live Births               Home Medications    Prior to Admission medications   Medication Sig Start Date End Date Taking? Authorizing Provider  medroxyPROGESTERone  (DEPO-PROVERA ) 150 MG/ML injection Inject 1 mL (150 mg total) into the muscle every 3 (three) months. 06/04/23  Yes Tresia Fruit, MD  metroNIDAZOLE  (FLAGYL ) 500 MG tablet Take 1 tablet (500 mg total) by mouth 2 (two) times daily. 06/30/23   Marci Setter, MD  albuterol  (PROVENTIL ) (2.5 MG/3ML) 0.083% nebulizer solution Take 3 mLs (2.5 mg total) by nebulization every 6 (six) hours as needed for wheezing or shortness of breath. 03/12/18 11/06/18  Burky, Natalie B, NP  ipratropium (ATROVENT ) 0.06 % nasal spray Place 2 sprays into both nostrils 3 (three) times daily as needed for rhinitis. 11/06/18 11/25/18  Bynum Cassis, NP    Family History Family History  Problem Relation Age of Onset   Healthy Mother    Anxiety disorder Father  Depression Father    Healthy Father     Social History Social History   Tobacco Use   Smoking status: Never   Smokeless tobacco: Never  Vaping Use   Vaping status: Former  Substance Use Topics   Alcohol use: Yes    Comment: Ocasionally   Drug use: Yes    Types: Marijuana     Allergies   Patient has no known allergies.   Review of Systems Review of Systems Per HPI  Physical Exam Triage Vital Signs ED Triage Vitals  Encounter Vitals Group     BP 07/07/23 0956 120/83     Girls Systolic BP Percentile --      Girls Diastolic BP Percentile --      Boys Systolic BP Percentile --      Boys Diastolic BP Percentile --      Pulse Rate 07/07/23 0956 74     Resp 07/07/23 0956 16     Temp  07/07/23 0956 98.9 F (37.2 C)     Temp Source 07/07/23 0956 Oral     SpO2 07/07/23 0956 98 %     Weight --      Height --      Head Circumference --      Peak Flow --      Pain Score 07/07/23 0954 0     Pain Loc --      Pain Education --      Exclude from Growth Chart --    No data found.  Updated Vital Signs BP 120/83 (BP Location: Right Arm)   Pulse 74   Temp 98.9 F (37.2 C) (Oral)   Resp 16   LMP 06/28/2023 (Exact Date)   SpO2 98%   Visual Acuity Right Eye Distance:   Left Eye Distance:   Bilateral Distance:    Right Eye Near:   Left Eye Near:    Bilateral Near:     Physical Exam Vitals and nursing note reviewed.  Constitutional:      General: She is not in acute distress.    Appearance: Normal appearance.  HENT:     Head: Normocephalic.   Eyes:     Extraocular Movements: Extraocular movements intact.     Pupils: Pupils are equal, round, and reactive to light.    Cardiovascular:     Rate and Rhythm: Normal rate.     Pulses: Normal pulses.     Heart sounds: Normal heart sounds.  Pulmonary:     Effort: Pulmonary effort is normal. No respiratory distress.     Breath sounds: Normal breath sounds. No stridor. No wheezing, rhonchi or rales.  Abdominal:     Palpations: Abdomen is soft.     Tenderness: There is no abdominal tenderness.   Musculoskeletal:     Cervical back: Normal range of motion.   Skin:    General: Skin is warm and dry.   Neurological:     General: No focal deficit present.     Mental Status: She is alert and oriented to person, place, and time.   Psychiatric:        Mood and Affect: Mood normal.        Behavior: Behavior normal.      UC Treatments / Results  Labs (all labs ordered are listed, but only abnormal results are displayed) Labs Reviewed - No data to display  EKG   Radiology No results found.  Procedures Procedures (including critical care time)  Medications Ordered in UC Medications -  No data to  display  Initial Impression / Assessment and Plan / UC Course  I have reviewed the triage vital signs and the nursing notes.  Pertinent labs & imaging results that were available during my care of the patient were reviewed by me and considered in my medical decision making (see chart for details).  Patient presents for STI testing.  Patient is experiencing vaginal discharge. Cytology and HIV/syphilis testing are pending.  Supportive care recommendations were provided and discussed with the patient to include refraining from sexual intercourse until test results have been received, notifying all partners if test results are positive, and refrain from sexual intercourse for 7 days after treatment if it is indicated.  Patient also encouraged to increase condom use.  Patient was in agreement with this plan of care and verbalizes understanding.  All questions were answered.  Patient stable for discharge.    Final Clinical Impressions(s) / UC Diagnoses   Final diagnoses:  None   Discharge Instructions   None    ED Prescriptions   None    PDMP not reviewed this encounter.   Hardy Lia, NP 07/07/23 1012

## 2023-07-08 LAB — CERVICOVAGINAL ANCILLARY ONLY
Bacterial Vaginitis (gardnerella): NEGATIVE
Candida Glabrata: NEGATIVE
Candida Vaginitis: NEGATIVE
Chlamydia: NEGATIVE
Comment: NEGATIVE
Comment: NEGATIVE
Comment: NEGATIVE
Comment: NEGATIVE
Comment: NEGATIVE
Comment: NORMAL
Neisseria Gonorrhea: NEGATIVE
Trichomonas: NEGATIVE

## 2023-07-08 LAB — RPR: RPR Ser Ql: NONREACTIVE

## 2023-07-08 LAB — HIV ANTIBODY (ROUTINE TESTING W REFLEX): HIV Screen 4th Generation wRfx: NONREACTIVE

## 2023-07-13 ENCOUNTER — Ambulatory Visit (HOSPITAL_COMMUNITY)
Admission: EM | Admit: 2023-07-13 | Discharge: 2023-07-13 | Disposition: A | Discharge status: Home / Self Care | Attending: Nurse Practitioner | Admitting: Nurse Practitioner

## 2023-07-13 ENCOUNTER — Encounter (HOSPITAL_COMMUNITY): Payer: Self-pay

## 2023-07-13 ENCOUNTER — Telehealth (HOSPITAL_COMMUNITY): Payer: Self-pay

## 2023-07-13 DIAGNOSIS — H1032 Unspecified acute conjunctivitis, left eye: Secondary | ICD-10-CM | POA: Insufficient documentation

## 2023-07-13 DIAGNOSIS — Z113 Encounter for screening for infections with a predominantly sexual mode of transmission: Secondary | ICD-10-CM | POA: Diagnosis not present

## 2023-07-13 MED ORDER — POLYMYXIN B-TRIMETHOPRIM 10000-0.1 UNIT/ML-% OP SOLN: 1.0000 [drp] | OPHTHALMIC | 0 refills | Status: AC

## 2023-07-13 MED ORDER — POLYMYXIN B-TRIMETHOPRIM 10000-0.1 UNIT/ML-% OP SOLN: 1.0000 [drp] | OPHTHALMIC | 0 refills | Status: DC

## 2023-07-13 NOTE — ED Triage Notes (Addendum)
 Patient here today with c/o left eye pain, redness, and drainage X 1 day. Patient woke up this morning with crust in her eye.   Patient would also like to be tested for Stds. (Swab only)

## 2023-07-13 NOTE — Telephone Encounter (Signed)
 Pt called stating [facility] was out of meds until this evening. Pharmacy updated.

## 2023-07-13 NOTE — Discharge Instructions (Addendum)
 You are being treated for conjunctivitis, also known as pink eye. This condition typically causes eye redness, watery discharge, mild crusting, light sensitivity, and discomfort with eye movement. Use the prescribed Polytrim eye drops as directed. Apply warm compresses to the affected eye several times a day to help with discomfort and drainage. Avoid rubbing your eye and wash your hands frequently to prevent spreading the infection. Do not wear contact lenses until your symptoms have completely resolved.  You also had STD testing completed today using a vaginal swab for gonorrhea, chlamydia, and trichomoniasis. You declined blood testing at this time. Please check your MyChart account for results. You will be contacted directly if any test is positive.  Follow up with your primary care provider or eye doctor if your symptoms do not improve within 48 hours. Go to the emergency department if you develop vision changes, significant swelling around the eye, severe pain, or a fever over 100.38F.   Keep your upcoming eye appointment on July 22, 2023.

## 2023-07-13 NOTE — ED Provider Notes (Signed)
 [facility]    CSN: [id number] Arrival date & time: 07/13/23  0802      History   Chief Complaint Chief Complaint  Patient presents with   Eye Problem   SEXUALLY TRANSMITTED DISEASE    HPI Boneta Standre is a 23 y.o. female.   Discussed the use of AI scribe software for clinical note transcription with the patient, who gave verbal consent to proceed.   Clarisse Rodriges is a 23 y.o. female that presents with left eye pain and redness that started yesterday, accompanied by photosensitivity and headache. She also requests STD testing. The patient reports that her left eye began hurting really bad yesterday, prompting her to remove her contact lenses and use eye drops due to redness and pain. She experienced severe photosensitivity, describing that when she went outside, the sun made it feel like I got punched in the eye. She applied ice to alleviate the discomfort. This morning, she noticed increased eye crusting more than usual. The patient denies a history of pink eye but mentions a friend's suggestion that she might have scratched her eye. She confirms taking out her contacts daily and recalls having an eye infection in high school from sleeping with them in. The patient describes significant eye watering yesterday and confirms redness in the white of her eye. She rates her current eye pain as a 6 out of 10, noting that the sun still causes discomfort. Moving her eye up and down is painful, but blinking is not. Associated symptoms include a headache yesterday, which was severe enough to necessitate wearing sunglasses and covering her head for comfort. She took Tylenol  and slept for the remainder of the day. The patient denies any cough, cold, congestion, fever, chills, body aches, numbness, tingling, dizziness, or lightheadedness.  Regarding STD testing, the patient reports being sexually active with one female partner in the past 3 months, using condoms sometimes. She denies  any symptoms such as vaginal discharge, itching, odor, genital sores, or mouth sores. The patient is on birth control and reports her last menstrual period started on June 1st. She requests STD testing without any specific suspicion, opting for a swab test only.   The following portions of the patient's history were reviewed and updated as appropriate: allergies, current medications, past family history, past medical history, past social history, past surgical history, and problem list.    Past Medical History:  Diagnosis Date   Anxiety    Depression    Sickle cell anemia (HCC)    Wears contact lenses     Patient Active Problem List   Diagnosis Date Noted   Gross hematuria 07/25/2020   Complex tear of lateral meniscus of right knee as current injury    Right knee pain 09/14/2019   Marijuana abuse 12/31/2017   Other specified anxiety disorders 12/24/2017   MDD (major depressive disorder), recurrent episode, moderate (HCC) 12/08/2017   Other constipation 03/28/2016   Vaginal discharge 03/28/2016   Bacterial vaginosis 07/23/2014   Recurrent UTI 07/23/2014    Past Surgical History:  Procedure Laterality Date   KNEE ARTHROSCOPY Right 04/02/2020   Procedure: RIGHT KNEE ARTHROSCOPY, DEBRIDEMENT;  Surgeon: Addie Cordella Hamilton, MD;  Location: [facility];  Service: Orthopedics;  Laterality: Right;   KNEE ARTHROSCOPY WITH LATERAL MENISECTOMY Right 04/02/2020   Procedure: KNEE ARTHROSCOPY WITH LATERAL Partial MENISECTOMY;  Surgeon: Addie Cordella Hamilton, MD;  Location: [facility];  Service: Orthopedics;  Laterality: Right;   TONSILLECTOMY  TONSILLECTOMY AND ADENOIDECTOMY N/A 11/22/2014   Procedure: TONSILLECTOMY AND ADENOIDECTOMY;  Surgeon: Carolee Hunter, MD;  Location: [facility];  Service: ENT;  Laterality: N/A;  ADENOIDS CAUTERIZED NO TISSUE SENT FOR PATHOLOGY    OB History     Gravida  1   Para      Term      Preterm      AB  1    Living         SAB  1   IAB      Ectopic      Multiple      Live Births               Home Medications    Prior to Admission medications   Medication Sig Start Date End Date Taking? Authorizing Provider  trimethoprim-polymyxin b (POLYTRIM) ophthalmic solution Place 1 drop into the left eye every 3 (three) hours while awake for 7 days. 07/13/23 07/20/23 Yes Iola Lukes, FNP  medroxyPROGESTERone  (DEPO-PROVERA ) 150 MG/ML injection Inject 1 mL (150 mg total) into the muscle every 3 (three) months. 06/04/23   Eveline Lynwood MATSU, MD  albuterol  (PROVENTIL ) (2.5 MG/3ML) 0.083% nebulizer solution Take 3 mLs (2.5 mg total) by nebulization every 6 (six) hours as needed for wheezing or shortness of breath. 03/12/18 11/06/18  Burky, Natalie B, NP  ipratropium (ATROVENT ) 0.06 % nasal spray Place 2 sprays into both nostrils 3 (three) times daily as needed for rhinitis. 11/06/18 11/25/18  Tellis Laneta NOVAK, NP    Family History Family History  Problem Relation Age of Onset   Healthy Mother    Anxiety disorder Father    Depression Father    Healthy Father     Social History Social History   Tobacco Use   Smoking status: Never   Smokeless tobacco: Never  Vaping Use   Vaping status: Former  Substance Use Topics   Alcohol use: Yes    Comment: Ocasionally   Drug use: Yes    Types: Marijuana     Allergies   Patient has no known allergies.   Review of Systems Review of Systems  Constitutional:  Negative for chills and fever.  HENT: Negative.  Negative for mouth sores.   Eyes:  Positive for photophobia, pain, discharge and redness. Negative for visual disturbance.  Respiratory: Negative.    Genitourinary:  Negative for dysuria, genital sores, menstrual problem (LMP: 06/28/23) and vaginal discharge.       No vaginal itching or irritation   Musculoskeletal: Negative.   Neurological:  Positive for headaches. Negative for dizziness and numbness.  All other systems reviewed and are  negative.    Physical Exam Triage Vital Signs ED Triage Vitals [07/13/23 0817]  Encounter Vitals Group     BP 114/77     Girls Systolic BP Percentile      Girls Diastolic BP Percentile      Boys Systolic BP Percentile      Boys Diastolic BP Percentile      Pulse Rate 79     Resp 16     Temp 99 F (37.2 C)     Temp Source Oral     SpO2 98 %     Weight      Height      Head Circumference      Peak Flow      Pain Score 6     Pain Loc      Pain Education      Exclude from  Growth Chart    No data found.  Updated Vital Signs BP 114/77 (BP Location: Left Arm)   Pulse 79   Temp 99 F (37.2 C) (Oral)   Resp 16   LMP 06/28/2023 (Exact Date)   SpO2 98%   Visual Acuity Right Eye Distance:   Left Eye Distance:   Bilateral Distance:    Right Eye Near:   Left Eye Near:    Bilateral Near:     Physical Exam Vitals reviewed.  Constitutional:      General: She is not in acute distress.    Appearance: Normal appearance. She is not toxic-appearing.  HENT:     Head: Normocephalic.     Mouth/Throat:     Mouth: Mucous membranes are moist.   Eyes:     General: Lids are normal. Lids are everted, no foreign bodies appreciated. Vision grossly intact. Gaze aligned appropriately. No visual field deficit.       Left eye: No foreign body, discharge or hordeolum.     Extraocular Movements: Extraocular movements intact.     Left eye: Normal extraocular motion.     Conjunctiva/sclera: Conjunctivae normal.     Left eye: Left conjunctiva is not injected.     Pupils: Pupils are equal, round, and reactive to light.    Cardiovascular:     Rate and Rhythm: Normal rate and regular rhythm.     Heart sounds: Normal heart sounds.  Pulmonary:     Effort: Pulmonary effort is normal.     Breath sounds: Normal breath sounds.   Musculoskeletal:        General: Normal range of motion.   Skin:    General: Skin is warm and dry.   Neurological:     General: No focal deficit present.      Mental Status: She is alert and oriented to person, place, and time.      UC Treatments / Results  Labs (all labs ordered are listed, but only abnormal results are displayed) Labs Reviewed  CERVICOVAGINAL ANCILLARY ONLY    EKG   Radiology No results found.  Procedures Procedures (including critical care time)  Medications Ordered in UC Medications - No data to display  Initial Impression / Assessment and Plan / UC Course  I have reviewed the triage vital signs and the nursing notes.  Pertinent labs & imaging results that were available during my care of the patient were reviewed by me and considered in my medical decision making (see chart for details).     The patient presents with left eye pain, redness, photosensitivity, watery discharge, crusting, and discomfort with eye movement for the past day. No systemic symptoms or recent contact lens use. Exam findings and history support a diagnosis of viral conjunctivitis, as bacterial conjunctivitis and orbital cellulitis have been clinically ruled out. Treatment includes Polytrim eye drops, warm compresses, and avoiding contact lens use until symptoms resolve. Patient was advised to follow up with their primary care provider or eye doctor if no improvement is seen in 48 hours and to return to the ER for vision changes, worsening pain, facial swelling, or high fever. Patient should maintain their scheduled eye appointment on July 22, 2023. Additionally, patient requested STD screening without active symptoms. She reports two female partners in the past three months with inconsistent condom use. A vaginal swab was collected for gonorrhea, chlamydia, and trichomoniasis; the patient declined blood STI testing. She was advised to check MyChart for results and will be contacted if results  are positive.  Today's evaluation has revealed no signs of a dangerous process. Discussed diagnosis with patient and/or guardian. Patient and/or guardian  aware of their diagnosis, possible red flag symptoms to watch out for and need for close follow up. Patient and/or guardian understands verbal and written discharge instructions. Patient and/or guardian comfortable with plan and disposition.  Patient and/or guardian has a clear mental status at this time, good insight into illness (after discussion and teaching) and has clear judgment to make decisions regarding their care  Documentation was completed with the aid of voice recognition software. Transcription may contain typographical errors. Final Clinical Impressions(s) / UC Diagnoses   Final diagnoses:  Acute conjunctivitis of left eye, unspecified acute conjunctivitis type  Screen for STD (sexually transmitted disease)     Discharge Instructions      You are being treated for conjunctivitis, also known as pink eye. This condition typically causes eye redness, watery discharge, mild crusting, light sensitivity, and discomfort with eye movement. Use the prescribed Polytrim eye drops as directed. Apply warm compresses to the affected eye several times a day to help with discomfort and drainage. Avoid rubbing your eye and wash your hands frequently to prevent spreading the infection. Do not wear contact lenses until your symptoms have completely resolved.  You also had STD testing completed today using a vaginal swab for gonorrhea, chlamydia, and trichomoniasis. You declined blood testing at this time. Please check your MyChart account for results. You will be contacted directly if any test is positive.  Follow up with your primary care provider or eye doctor if your symptoms do not improve within 48 hours. Go to the emergency department if you develop vision changes, significant swelling around the eye, severe pain, or a fever over 100.48F.   Keep your upcoming eye appointment on July 22, 2023.      ED Prescriptions     Medication Sig Dispense Auth. Provider   trimethoprim-polymyxin b  (POLYTRIM) ophthalmic solution Place 1 drop into the left eye every 3 (three) hours while awake for 7 days. 10 mL Iola Lukes, FNP      PDMP not reviewed this encounter.   Iola [place], [place] 07/13/23 [phone]

## 2023-07-14 ENCOUNTER — Ambulatory Visit: Payer: Self-pay | Admitting: Obstetrics & Gynecology

## 2023-07-14 LAB — CERVICOVAGINAL ANCILLARY ONLY
Chlamydia: NEGATIVE
Comment: NEGATIVE
Comment: NEGATIVE
Comment: NORMAL
Neisseria Gonorrhea: NEGATIVE
Trichomonas: NEGATIVE

## 2023-07-14 NOTE — Progress Notes (Signed)
 Repeat pap in one year, normal pathology

## 2023-07-22 ENCOUNTER — Encounter (HOSPITAL_COMMUNITY): Payer: Self-pay

## 2023-07-22 ENCOUNTER — Ambulatory Visit (HOSPITAL_COMMUNITY)
Admission: RE | Admit: 2023-07-22 | Discharge: 2023-07-22 | Disposition: A | Discharge status: Home / Self Care | Source: Ambulatory Visit | Attending: Physician Assistant | Admitting: Physician Assistant

## 2023-07-22 VITALS — BP 125/84 | HR 79 | Temp 98.7°F | Resp 16

## 2023-07-22 DIAGNOSIS — Z113 Encounter for screening for infections with a predominantly sexual mode of transmission: Secondary | ICD-10-CM | POA: Insufficient documentation

## 2023-07-22 DIAGNOSIS — D573 Sickle-cell trait: Secondary | ICD-10-CM | POA: Diagnosis not present

## 2023-07-22 DIAGNOSIS — R519 Headache, unspecified: Secondary | ICD-10-CM | POA: Insufficient documentation

## 2023-07-22 DIAGNOSIS — R109 Unspecified abdominal pain: Secondary | ICD-10-CM | POA: Diagnosis present

## 2023-07-22 DIAGNOSIS — R110 Nausea: Secondary | ICD-10-CM | POA: Diagnosis present

## 2023-07-22 LAB — CBC WITH DIFFERENTIAL/PLATELET
Abs Immature Granulocytes: 0.01 10*3/uL (ref 0.00–0.07)
Basophils Absolute: 0 10*3/uL (ref 0.0–0.1)
Basophils Relative: 0 %
Eosinophils Absolute: 0.2 10*3/uL (ref 0.0–0.5)
Eosinophils Relative: 3 %
HCT: 42.4 % (ref 36.0–46.0)
Hemoglobin: 14.8 g/dL (ref 12.0–15.0)
Immature Granulocytes: 0 %
Lymphocytes Relative: 51 %
Lymphs Abs: 3.4 10*3/uL (ref 0.7–4.0)
MCH: 30.4 pg (ref 26.0–34.0)
MCHC: 34.9 g/dL (ref 30.0–36.0)
MCV: 87.1 fL (ref 80.0–100.0)
Monocytes Absolute: 0.4 10*3/uL (ref 0.1–1.0)
Monocytes Relative: 5 %
Neutro Abs: 2.8 10*3/uL (ref 1.7–7.7)
Neutrophils Relative %: 41 %
Platelets: 344 10*3/uL (ref 150–400)
RBC: 4.87 MIL/uL (ref 3.87–5.11)
RDW: 12.8 % (ref 11.5–15.5)
WBC: 6.8 10*3/uL (ref 4.0–10.5)
nRBC: 0 % (ref 0.0–0.2)

## 2023-07-22 LAB — POCT URINALYSIS DIP (MANUAL ENTRY)
Bilirubin, UA: NEGATIVE
Glucose, UA: NEGATIVE mg/dL
Ketones, POC UA: NEGATIVE mg/dL
Nitrite, UA: NEGATIVE
Protein Ur, POC: 30 mg/dL — AB
Spec Grav, UA: 1.02 (ref 1.010–1.025)
Urobilinogen, UA: 0.2 U/dL
pH, UA: 6.5 (ref 5.0–8.0)

## 2023-07-22 LAB — POCT URINE PREGNANCY: Preg Test, Ur: NEGATIVE

## 2023-07-22 NOTE — ED Triage Notes (Addendum)
 Patient here today with c/o nausea and abd pain since yesterday. Patient states that she has also had some fatigue over the last 2 weeks and has the Sickle Cell Trait. Patient also concerned with her iron level.   Patient would also like to have Std testing.

## 2023-07-22 NOTE — ED Provider Notes (Signed)
 [facility]    CSN: [id number] Arrival date & time: 07/22/23  1346      History   Chief Complaint Chief Complaint  Patient presents with   Nausea    Entered by patient    HPI Kimberly Roach is a 23 y.o. female.   Pt complains of frequent headaches.  Patient reports no relief with Tylenol .  Patient states that ibuprofen  works but she has been told not to take ibuprofen  because of anemia.  Patient reports she has a past medical history of sickle cell trait.  Patient has had iron deficiency anemia in the past and has had iron transfusions.  Patient denies any fever or chills she denies any cough or congestion.  Patient denies any sinus discomfort.  Patient states that headaches may be related to the heat     Past Medical History:  Diagnosis Date   Anxiety    Depression    Sickle cell anemia (HCC)    Wears contact lenses     Patient Active Problem List   Diagnosis Date Noted   Gross hematuria 07/25/2020   Complex tear of lateral meniscus of right knee as current injury    Right knee pain 09/14/2019   Marijuana abuse 12/31/2017   Other specified anxiety disorders 12/24/2017   MDD (major depressive disorder), recurrent episode, moderate (HCC) 12/08/2017   Other constipation 03/28/2016   Vaginal discharge 03/28/2016   Bacterial vaginosis 07/23/2014   Recurrent UTI 07/23/2014    Past Surgical History:  Procedure Laterality Date   KNEE ARTHROSCOPY Right 04/02/2020   Procedure: RIGHT KNEE ARTHROSCOPY, DEBRIDEMENT;  Surgeon: Addie Cordella Hamilton, MD;  Location: [facility];  Service: Orthopedics;  Laterality: Right;   KNEE ARTHROSCOPY WITH LATERAL MENISECTOMY Right 04/02/2020   Procedure: KNEE ARTHROSCOPY WITH LATERAL Partial MENISECTOMY;  Surgeon: Addie Cordella Hamilton, MD;  Location: [facility];  Service: Orthopedics;  Laterality: Right;   TONSILLECTOMY     TONSILLECTOMY AND ADENOIDECTOMY N/A 11/22/2014   Procedure: TONSILLECTOMY AND  ADENOIDECTOMY;  Surgeon: Carolee Hunter, MD;  Location: [facility];  Service: ENT;  Laterality: N/A;  ADENOIDS CAUTERIZED NO TISSUE SENT FOR PATHOLOGY    OB History     Gravida  1   Para      Term      Preterm      AB  1   Living         SAB  1   IAB      Ectopic      Multiple      Live Births               Home Medications    Prior to Admission medications   Medication Sig Start Date End Date Taking? Authorizing Provider  medroxyPROGESTERone  (DEPO-PROVERA ) 150 MG/ML injection Inject 1 mL (150 mg total) into the muscle every 3 (three) months. 06/04/23   Eveline Lynwood MATSU, MD  albuterol  (PROVENTIL ) (2.5 MG/3ML) 0.083% nebulizer solution Take 3 mLs (2.5 mg total) by nebulization every 6 (six) hours as needed for wheezing or shortness of breath. 03/12/18 11/06/18  Burky, Natalie B, NP  ipratropium (ATROVENT ) 0.06 % nasal spray Place 2 sprays into both nostrils 3 (three) times daily as needed for rhinitis. 11/06/18 11/25/18  Tellis Laneta NOVAK, NP    Family History Family History  Problem Relation Age of Onset   Healthy Mother    Anxiety disorder Father    Depression Father    Healthy Father  Social History Social History   Tobacco Use   Smoking status: Never   Smokeless tobacco: Never  Vaping Use   Vaping status: Former  Substance Use Topics   Alcohol use: Yes    Comment: Ocasionally   Drug use: Yes    Types: Marijuana     Allergies   Patient has no known allergies.   Review of Systems Review of Systems  All other systems reviewed and are negative.    Physical Exam Triage Vital Signs ED Triage Vitals  Encounter Vitals Group     BP 07/22/23 1435 125/84     Girls Systolic BP Percentile --      Girls Diastolic BP Percentile --      Boys Systolic BP Percentile --      Boys Diastolic BP Percentile --      Pulse Rate 07/22/23 1435 79     Resp 07/22/23 1435 16     Temp 07/22/23 1435 98.7 F (37.1 C)     Temp Source 07/22/23 1435  Oral     SpO2 07/22/23 1435 98 %     Weight --      Height --      Head Circumference --      Peak Flow --      Pain Score 07/22/23 1434 5     Pain Loc --      Pain Education --      Exclude from Growth Chart --    No data found.  Updated Vital Signs BP 125/84 (BP Location: Left Arm)   Pulse 79   Temp 98.7 F (37.1 C) (Oral)   Resp 16   LMP 06/28/2023 (Exact Date)   SpO2 98%   Visual Acuity Right Eye Distance:   Left Eye Distance:   Bilateral Distance:    Right Eye Near:   Left Eye Near:    Bilateral Near:     Physical Exam Vitals and nursing note reviewed.  Constitutional:      Appearance: She is well-developed.  HENT:     Head: Normocephalic.  Cardiovascular:     Rate and Rhythm: Normal rate.  Pulmonary:     Effort: Pulmonary effort is normal.  Abdominal:     General: There is no distension.  Musculoskeletal:        General: Normal range of motion.  Skin:    General: Skin is warm.  Neurological:     General: No focal deficit present.     Mental Status: She is alert and oriented to person, place, and time.      UC Treatments / Results  Labs (all labs ordered are listed, but only abnormal results are displayed) Labs Reviewed  POCT URINALYSIS DIP (MANUAL ENTRY) - Abnormal; Notable for the following components:      Result Value   Clarity, UA cloudy (*)    Blood, UA trace-intact (*)    Protein Ur, POC =30 (*)    Leukocytes, UA Small (1+) (*)    All other components within normal limits  CBC WITH DIFFERENTIAL/PLATELET  POCT URINE PREGNANCY    EKG   Radiology No results found.  Procedures Procedures (including critical care time)  Medications Ordered in UC Medications - No data to display  Initial Impression / Assessment and Plan / UC Course  I have reviewed the triage vital signs and the nursing notes.  Pertinent labs & imaging results that were available during my care of the patient were reviewed by me and considered  in my medical  decision making (see chart for details).     Pregnancy test is negative CBC is ordered to assess patient for possible anemia.  Patient is advised it is okay to take a single dose of ibuprofen  for her headache but she should not take frequently. Final Clinical Impressions(s) / UC Diagnoses   Final diagnoses:  Nonintractable headache, unspecified chronicity pattern, unspecified headache type     Discharge Instructions      Your blood work is pending   ED Prescriptions   None    PDMP not reviewed this encounter. An After Visit Summary was printed and given to the patient.       Flint Sonny POUR, PA-C 07/22/23 [phone]

## 2023-07-22 NOTE — Discharge Instructions (Signed)
Your blood work is pending

## 2023-07-23 ENCOUNTER — Ambulatory Visit (HOSPITAL_COMMUNITY): Payer: Self-pay

## 2023-07-23 LAB — CERVICOVAGINAL ANCILLARY ONLY
Bacterial Vaginitis (gardnerella): NEGATIVE
Candida Glabrata: NEGATIVE
Candida Vaginitis: POSITIVE — AB
Chlamydia: NEGATIVE
Comment: NEGATIVE
Comment: NEGATIVE
Comment: NEGATIVE
Comment: NEGATIVE
Comment: NEGATIVE
Comment: NORMAL
Neisseria Gonorrhea: NEGATIVE
Trichomonas: NEGATIVE

## 2023-07-24 MED ORDER — FLUCONAZOLE 150 MG PO TABS: 150.0000 mg | ORAL_TABLET | Freq: Once | ORAL | 0 refills | Status: AC

## 2023-08-20 ENCOUNTER — Encounter (HOSPITAL_COMMUNITY): Payer: Self-pay | Admitting: *Deleted

## 2023-08-20 ENCOUNTER — Ambulatory Visit (HOSPITAL_COMMUNITY): Admission: EM | Admit: 2023-08-20 | Discharge: 2023-08-20 | Disposition: A | Discharge status: Home / Self Care

## 2023-08-20 DIAGNOSIS — S0501XA Injury of conjunctiva and corneal abrasion without foreign body, right eye, initial encounter: Secondary | ICD-10-CM | POA: Diagnosis not present

## 2023-08-20 DIAGNOSIS — Z113 Encounter for screening for infections with a predominantly sexual mode of transmission: Secondary | ICD-10-CM | POA: Insufficient documentation

## 2023-08-20 DIAGNOSIS — X58XXXA Exposure to other specified factors, initial encounter: Secondary | ICD-10-CM | POA: Diagnosis not present

## 2023-08-20 MED ORDER — FLUORESCEIN SODIUM 1 MG OP STRP
ORAL_STRIP | OPHTHALMIC | Status: AC
  Filled 2023-08-20: qty 1

## 2023-08-20 MED ORDER — TETRACAINE HCL 0.5 % OP SOLN
OPHTHALMIC | Status: AC
  Filled 2023-08-20: qty 4

## 2023-08-20 MED ORDER — ERYTHROMYCIN 5 MG/GM OP OINT: TOPICAL_OINTMENT | OPHTHALMIC | 0 refills | Status: DC

## 2023-08-20 NOTE — ED Provider Notes (Signed)
 [facility]    CSN: [id number] Arrival date & time: 08/20/23  0802      History   Chief Complaint Chief Complaint  Patient presents with   Eye Problem   SEXUALLY TRANSMITTED DISEASE    HPI Kimberly Roach is a 23 y.o. female.   HPI Patient is a 23 year old female who present right eye irritation.  She reports that when she woke up this morning, she felt as if there was an eyelash in the corner of her eye.  She tried rubbing the eye to get it out but was unable to see anything and her symptoms did not improve.  She denies any changes in vision, pus drainage, swelling of the eye, pain with extraocular movement, vomiting, headache, fever, or other concerns at this time.  Patient would also like to be tested for STIs.  She is not having any symptoms today and denies any vaginal discharge, odor, abdominal pain, rash, or other concerns.  Patient does wear contacts but has not worn any since Sunday.  Past Medical History:  Diagnosis Date   Anxiety    Depression    Sickle cell anemia (HCC)    Wears contact lenses     Patient Active Problem List   Diagnosis Date Noted   Gross hematuria 07/25/2020   Complex tear of lateral meniscus of right knee as current injury    Right knee pain 09/14/2019   Marijuana abuse 12/31/2017   Other specified anxiety disorders 12/24/2017   MDD (major depressive disorder), recurrent episode, moderate (HCC) 12/08/2017   Other constipation 03/28/2016   Vaginal discharge 03/28/2016   Bacterial vaginosis 07/23/2014   Recurrent UTI 07/23/2014    Past Surgical History:  Procedure Laterality Date   KNEE ARTHROSCOPY Right 04/02/2020   Procedure: RIGHT KNEE ARTHROSCOPY, DEBRIDEMENT;  Surgeon: Addie Cordella Hamilton, MD;  Location: [facility];  Service: Orthopedics;  Laterality: Right;   KNEE ARTHROSCOPY WITH LATERAL MENISECTOMY Right 04/02/2020   Procedure: KNEE ARTHROSCOPY WITH LATERAL Partial MENISECTOMY;  Surgeon: Addie Cordella Hamilton,  MD;  Location: [facility];  Service: Orthopedics;  Laterality: Right;   TONSILLECTOMY     TONSILLECTOMY AND ADENOIDECTOMY N/A 11/22/2014   Procedure: TONSILLECTOMY AND ADENOIDECTOMY;  Surgeon: Carolee Hunter, MD;  Location: [facility];  Service: ENT;  Laterality: N/A;  ADENOIDS CAUTERIZED NO TISSUE SENT FOR PATHOLOGY    OB History     Gravida  1   Para      Term      Preterm      AB  1   Living         SAB  1   IAB      Ectopic      Multiple      Live Births               Home Medications    Prior to Admission medications   Medication Sig Start Date End Date Taking? Authorizing Provider  erythromycin  ophthalmic ointment Place a 1/2 inch ribbon of ointment into the lower eyelid. 08/20/23  Yes Melonie Locus, PA-C  medroxyPROGESTERone  (DEPO-PROVERA ) 150 MG/ML injection Inject 1 mL (150 mg total) into the muscle every 3 (three) months. 06/04/23   Eveline Lynwood MATSU, MD  albuterol  (PROVENTIL ) (2.5 MG/3ML) 0.083% nebulizer solution Take 3 mLs (2.5 mg total) by nebulization every 6 (six) hours as needed for wheezing or shortness of breath. 03/12/18 11/06/18  Burky, Natalie B, NP  ipratropium (ATROVENT ) 0.06 % nasal spray  Place 2 sprays into both nostrils 3 (three) times daily as needed for rhinitis. 11/06/18 11/25/18  Tellis Laneta NOVAK, NP    Family History Family History  Problem Relation Age of Onset   Healthy Mother    Anxiety disorder Father    Depression Father    Healthy Father     Social History Social History   Tobacco Use   Smoking status: Never   Smokeless tobacco: Never  Vaping Use   Vaping status: Former  Substance Use Topics   Alcohol use: Yes    Comment: Ocasionally   Drug use: Yes    Types: Marijuana     Allergies   Patient has no known allergies.   Review of Systems Review of Systems See HPI for relevant ROS.  Physical Exam Triage Vital Signs ED Triage Vitals  Encounter Vitals Group     BP 08/20/23 0813  93/70     Girls Systolic BP Percentile --      Girls Diastolic BP Percentile --      Boys Systolic BP Percentile --      Boys Diastolic BP Percentile --      Pulse Rate 08/20/23 0813 85     Resp 08/20/23 0813 16     Temp 08/20/23 0813 98.2 F (36.8 C)     Temp Source 08/20/23 0813 Oral     SpO2 08/20/23 0813 98 %     Weight --      Height --      Head Circumference --      Peak Flow --      Pain Score 08/20/23 0811 3     Pain Loc --      Pain Education --      Exclude from Growth Chart --    No data found.  Updated Vital Signs BP 93/70 (BP Location: Left Arm)   Pulse 85   Temp 98.2 F (36.8 C) (Oral)   Resp 16   LMP 06/28/2023 (Exact Date) Comment: pt on depo  SpO2 98%   Visual Acuity Right Eye Distance: 20/25 Left Eye Distance: 20/30 Bilateral Distance: 20/25  Right Eye Near:   Left Eye Near:    Bilateral Near:     Physical Exam General: Alert and oriented, well-developed/well-nourished, calm, cooperative, no acute distress HEENT: Normocephalic atraumatic, moist mucous membranes, no scleral icterus, trachea midline, PERRL, no pain with extraocular movement Lungs: Speaking full sentences, non-labored respirations, no distress Abdomen:  Soft, nondistended Musculoskeletal: Moves all extremities well Neurologic: Awake, A&O x4, gait normal Integumentary: Warm, dry, normal for ethnicity, intact, no rash Psychiatric: Appropriate mood & affect  UC Treatments / Results  Labs (all labs ordered are listed, but only abnormal results are displayed) Labs Reviewed  CERVICOVAGINAL ANCILLARY ONLY    EKG   Radiology No results found.  Procedures Procedures (including critical care time)  Medications Ordered in UC Medications - No data to display  Initial Impression / Assessment and Plan / UC Course  I have reviewed the triage vital signs and the nursing notes.  Pertinent labs & imaging results that were available during my care of the patient were reviewed by me  and considered in my medical decision making (see chart for details).    Presents with right eye irritation.  Differential Diagnosis: Bacterial conjunctivitis, allergic conjunctivitis, viral conjunctivitis, corneal abrasion, blepharitis, keratitis, uveitis, acute closure glaucoma, preseptal cellulitis, orbital cellulitis, including other diagnoses.  Rationale: Patient presents with right eye irritation.  Other than rubbing her eye  this morning, no recent eye trauma or suspected microtrauma (dust, sand, etc). Negative Seidel sign.on fluorescein  stain, patient has a small corneal abrasion of the medial right eye. No significant photophobia or changes in visual acuity.  Prescribe patient erythromycin  ointment for lubrication.  Recommended patient refrain from wearing contacts while the eye is healing. Recommended patient follow-up with their primary care provider or eye doctor in the next several days as needed.  Discussed return precautions to the urgent care or emergency department including if patient develops fever, swelling of the eyelid, pain with extraocular movement, neurologic symptoms, worsening of symptoms, or if they have any other concerns.  Lastly, patient performed a self swab to check for STIs and we have sent this off to the lab.  We will notify patient and prescribe medications at that time if indicated.  Disposition: Stable to discharge home.   All questions answered to the best of this examiner's ability. Reviewed possible severe sequelae and other reasons to return to urgent care or ED for further evaluation and/or treatment. Patient voices understanding of the above and agrees to plan.  An appropriate evaluation has been performed, and in my medical judgment there is currently no evidence of an immediate life-threatening or surgical condition. Discharge is therefore indicated at this time.  This document was created using the aid of voice recognition Herbalist. Final Clinical Impressions(s) / UC Diagnoses   Final diagnoses:  Abrasion of right cornea, initial encounter  Screening examination for STI     Discharge Instructions      We have sent in ointment to help with your symptoms wearing your contacts for the next several days.  Please follow-up with an eye doctor if symptoms are not improving.  Additionally, we have sent off your swab to the lab.  You should get the results in about 3 to 5 days.  We will call you at that time and prescribe any medications if indicated.  Please return to the emergency department if you develop any significant swelling of the eye, pain with eye movement, neurologic changes, began vomiting, or if you have any other concerns.    ED Prescriptions     Medication Sig Dispense Auth. Provider   erythromycin  ophthalmic ointment Place a 1/2 inch ribbon of ointment into the lower eyelid. 3.5 g Melonie Locus, PA-C      PDMP not reviewed this encounter.   Melonie Locus, PA-C 08/20/23 [phone]

## 2023-08-20 NOTE — Discharge Instructions (Signed)
 We have sent in ointment to help with your symptoms wearing your contacts for the next several days.  Please follow-up with an eye doctor if symptoms are not improving.  Additionally, we have sent off your swab to the lab.  You should get the results in about 3 to 5 days.  We will call you at that time and prescribe any medications if indicated.  Please return to the emergency department if you develop any significant swelling of the eye, pain with eye movement, neurologic changes, began vomiting, or if you have any other concerns.

## 2023-08-20 NOTE — ED Triage Notes (Signed)
 Pt states her right eye is hurting on the inside since this morning. She put some of the drops she got at a previous visit which burned.   She would like STI cyto only today, denies any sx.

## 2023-08-21 ENCOUNTER — Ambulatory Visit (HOSPITAL_COMMUNITY): Payer: Self-pay

## 2023-08-21 LAB — CERVICOVAGINAL ANCILLARY ONLY
Bacterial Vaginitis (gardnerella): POSITIVE — AB
Candida Glabrata: NEGATIVE
Candida Vaginitis: NEGATIVE
Chlamydia: NEGATIVE
Comment: NEGATIVE
Comment: NEGATIVE
Comment: NEGATIVE
Comment: NEGATIVE
Comment: NEGATIVE
Comment: NORMAL
Neisseria Gonorrhea: NEGATIVE
Trichomonas: NEGATIVE

## 2023-08-24 MED ORDER — METRONIDAZOLE 0.75 % VA GEL: 1.0000 | Freq: Every day | VAGINAL | 0 refills | Status: AC

## 2023-10-05 ENCOUNTER — Ambulatory Visit (HOSPITAL_COMMUNITY): Admission: EM | Admit: 2023-10-05 | Discharge: 2023-10-05 | Disposition: A | Discharge status: Home / Self Care

## 2023-10-05 ENCOUNTER — Encounter (HOSPITAL_COMMUNITY): Payer: Self-pay | Admitting: Emergency Medicine

## 2023-10-05 ENCOUNTER — Other Ambulatory Visit: Payer: Self-pay

## 2023-10-05 DIAGNOSIS — J069 Acute upper respiratory infection, unspecified: Secondary | ICD-10-CM | POA: Diagnosis not present

## 2023-10-05 DIAGNOSIS — Z113 Encounter for screening for infections with a predominantly sexual mode of transmission: Secondary | ICD-10-CM | POA: Insufficient documentation

## 2023-10-05 DIAGNOSIS — U071 COVID-19: Secondary | ICD-10-CM | POA: Diagnosis present

## 2023-10-05 LAB — POC COVID19/FLU A&B COMBO
Covid Antigen, POC: POSITIVE — AB
Influenza A Antigen, POC: NEGATIVE
Influenza B Antigen, POC: NEGATIVE

## 2023-10-05 NOTE — ED Provider Notes (Signed)
 [facility]    CSN: [id number] Arrival date & time: 10/05/23  0805      History   Chief Complaint Chief Complaint  Patient presents with   Cough    HPI Kimberly Roach is a 23 y.o. female.    Cough Patient is a 23 year old female who presents today complaining of cough, headache, sinus pressure since Wednesday of last week.  Says she was on a cruise last week and start developing symptoms halfway through her cruise.  Thought she just had a sinus infection.  Has been taking Mucinex.  Denies any fevers.  Denies any respiratory difficulties.  Says her primary concern is headache, has been slowly improving.  She also endorses some new sexual contacts while she was away on a cruise and is requesting STI testing today.  Past Medical History:  Diagnosis Date   Anxiety    Depression    Sickle cell anemia (HCC)    Wears contact lenses     Patient Active Problem List   Diagnosis Date Noted   Gross hematuria 07/25/2020   Complex tear of lateral meniscus of right knee as current injury    Right knee pain 09/14/2019   Marijuana abuse 12/31/2017   Other specified anxiety disorders 12/24/2017   MDD (major depressive disorder), recurrent episode, moderate (HCC) 12/08/2017   Other constipation 03/28/2016   Vaginal discharge 03/28/2016   Bacterial vaginosis 07/23/2014   Recurrent UTI 07/23/2014    Past Surgical History:  Procedure Laterality Date   KNEE ARTHROSCOPY Right 04/02/2020   Procedure: RIGHT KNEE ARTHROSCOPY, DEBRIDEMENT;  Surgeon: Addie Cordella Hamilton, MD;  Location: [facility];  Service: Orthopedics;  Laterality: Right;   KNEE ARTHROSCOPY WITH LATERAL MENISECTOMY Right 04/02/2020   Procedure: KNEE ARTHROSCOPY WITH LATERAL Partial MENISECTOMY;  Surgeon: Addie Cordella Hamilton, MD;  Location: [facility];  Service: Orthopedics;  Laterality: Right;   TONSILLECTOMY     TONSILLECTOMY AND ADENOIDECTOMY N/A 11/22/2014   Procedure: TONSILLECTOMY  AND ADENOIDECTOMY;  Surgeon: Carolee Hunter, MD;  Location: [facility];  Service: ENT;  Laterality: N/A;  ADENOIDS CAUTERIZED NO TISSUE SENT FOR PATHOLOGY    OB History     Gravida  1   Para      Term      Preterm      AB  1   Living         SAB  1   IAB      Ectopic      Multiple      Live Births               Home Medications    Prior to Admission medications   Medication Sig Start Date End Date Taking? Authorizing Provider  erythromycin  ophthalmic ointment Place a 1/2 inch ribbon of ointment into the lower eyelid. 08/20/23   Melonie Locus, PA-C  medroxyPROGESTERone  (DEPO-PROVERA ) 150 MG/ML injection Inject 1 mL (150 mg total) into the muscle every 3 (three) months. 06/04/23   Eveline Lynwood MATSU, MD  albuterol  (PROVENTIL ) (2.5 MG/3ML) 0.083% nebulizer solution Take 3 mLs (2.5 mg total) by nebulization every 6 (six) hours as needed for wheezing or shortness of breath. 03/12/18 11/06/18  Burky, Natalie B, NP  ipratropium (ATROVENT ) 0.06 % nasal spray Place 2 sprays into both nostrils 3 (three) times daily as needed for rhinitis. 11/06/18 11/25/18  Tellis Laneta NOVAK, NP    Family History Family History  Problem Relation Age of Onset   Healthy Mother  Anxiety disorder Father    Depression Father    Healthy Father     Social History Social History   Tobacco Use   Smoking status: Never   Smokeless tobacco: Never  Vaping Use   Vaping status: Former  Substance Use Topics   Alcohol use: Yes    Comment: Ocasionally   Drug use: Yes    Types: Marijuana     Allergies   Patient has no known allergies.   Review of Systems Review of Systems  Respiratory:  Positive for cough.      Physical Exam Triage Vital Signs ED Triage Vitals  Encounter Vitals Group     BP 10/05/23 0827 109/81     Girls Systolic BP Percentile --      Girls Diastolic BP Percentile --      Boys Systolic BP Percentile --      Boys Diastolic BP Percentile --      Pulse  Rate 10/05/23 0827 88     Resp 10/05/23 0827 16     Temp 10/05/23 0827 98 F (36.7 C)     Temp Source 10/05/23 0827 Oral     SpO2 10/05/23 0827 100 %     Weight --      Height --      Head Circumference --      Peak Flow --      Pain Score 10/05/23 0823 10     Pain Loc --      Pain Education --      Exclude from Growth Chart --    No data found.  Updated Vital Signs BP 109/81 (BP Location: Left Arm)   Pulse 88   Temp 98 F (36.7 C) (Oral)   Resp 16   LMP 06/28/2023 Comment: did get a depo shot 4 months ago, but never went back for consecutive injections  SpO2 100%   Visual Acuity Right Eye Distance:   Left Eye Distance:   Bilateral Distance:    Right Eye Near:   Left Eye Near:    Bilateral Near:     Physical Exam Vitals and nursing note reviewed.  Constitutional:      General: She is not in acute distress.    Appearance: She is well-developed.  HENT:     Head: Normocephalic and atraumatic.     Right Ear: Tympanic membrane normal.     Left Ear: Tympanic membrane normal.     Nose: Nose normal.  Eyes:     Conjunctiva/sclera: Conjunctivae normal.  Cardiovascular:     Rate and Rhythm: Normal rate and regular rhythm.     Pulses: Normal pulses.     Heart sounds: Normal heart sounds. No murmur heard. Pulmonary:     Effort: Pulmonary effort is normal. No respiratory distress.     Breath sounds: Normal breath sounds.  Abdominal:     Palpations: Abdomen is soft.     Tenderness: There is no abdominal tenderness.  Musculoskeletal:        General: No swelling. Normal range of motion.     Cervical back: Neck supple.  Skin:    General: Skin is warm and dry.     Capillary Refill: Capillary refill takes less than 2 seconds.  Neurological:     Mental Status: She is alert.  Psychiatric:        Mood and Affect: Mood normal.      UC Treatments / Results  Labs (all labs ordered are listed, but only abnormal results  are displayed) Labs Reviewed  POC COVID19/FLU A&B  COMBO - Abnormal; Notable for the following components:      Result Value   Covid Antigen, POC Positive (*)    All other components within normal limits  CERVICOVAGINAL ANCILLARY ONLY    EKG   Radiology No results found.  Procedures Procedures (including critical care time)  Medications Ordered in UC Medications - No data to display  Initial Impression / Assessment and Plan / UC Course  I have reviewed the triage vital signs and the nursing notes.  Pertinent labs & imaging results that were available during my care of the patient were reviewed by me and considered in my medical decision making (see chart for details).    #Upper respiratory infection #COVID-19 infection Patient diagnosed with COVID-19 on POC testing today Patient denies any acute shortness of breath and vital signs are within normal limits Placed emphasis on rest, hydration, OTC cough medicine as needed Provided work note for patient not to return to work until 5 days after onset of symptoms and 24 hours after being symptom-free. Instructed patient to take a ibuprofen , Tylenol  as needed for headache Patient understands treatment plan no further questions or concerns at this time Return to clinic if symptoms acutely worsen.  ER precautions discussed.  #STD screening Patient has had new sexual contacts and is requesting STD screening. Completed vaginal swab today and will test for mania, gonorrhea, trichomonas Will be contacted via MyChart and phone if test returned positive.  Final Clinical Impressions(s) / UC Diagnoses   Final diagnoses:  Viral URI with cough  COVID-19  Screen for STD (sexually transmitted disease)     Discharge Instructions      You were diagnosed with COVID-19.  Continue with conservative management with emphasis placed on rest, hydration, and symptom management.  Can take Tylenol  500 mg and ibuprofen  600 mg simultaneously for headache and symptom management.  Can take these  medicines every 6 hours as needed.   ED Prescriptions   None    PDMP not reviewed this encounter.   Lynwood Barter, DO 10/05/23 [phone]

## 2023-10-05 NOTE — ED Triage Notes (Addendum)
 Complains of cough, headache, sinus pressure started Wednesday. Patient reports she was on a cruise at that time.  Has taken mucinex, tea( tea made her vomit).  No fever, sore throat initially.  Pressure behind eyes  Also requesting to do a std swab

## 2023-10-05 NOTE — Discharge Instructions (Addendum)
 You were diagnosed with COVID-19.  Continue with conservative management with emphasis placed on rest, hydration, and symptom management.  Can take Tylenol  500 mg and ibuprofen  600 mg simultaneously for headache and symptom management.  Can take these medicines every 6 hours as needed.

## 2023-10-06 LAB — CERVICOVAGINAL ANCILLARY ONLY
Chlamydia: NEGATIVE
Comment: NEGATIVE
Comment: NEGATIVE
Comment: NORMAL
Neisseria Gonorrhea: NEGATIVE
Trichomonas: NEGATIVE

## 2023-10-30 ENCOUNTER — Ambulatory Visit
Admission: RE | Admit: 2023-10-30 | Discharge: 2023-10-30 | Disposition: A | Discharge status: Home / Self Care | Source: Ambulatory Visit | Attending: Internal Medicine | Admitting: Internal Medicine

## 2023-10-30 VITALS — BP 121/72 | HR 84 | Temp 99.1°F | Resp 17

## 2023-10-30 DIAGNOSIS — Z113 Encounter for screening for infections with a predominantly sexual mode of transmission: Secondary | ICD-10-CM | POA: Insufficient documentation

## 2023-10-30 DIAGNOSIS — R5383 Other fatigue: Secondary | ICD-10-CM | POA: Diagnosis present

## 2023-10-30 DIAGNOSIS — Z9189 Other specified personal risk factors, not elsewhere classified: Secondary | ICD-10-CM | POA: Insufficient documentation

## 2023-10-30 DIAGNOSIS — D571 Sickle-cell disease without crisis: Secondary | ICD-10-CM | POA: Diagnosis present

## 2023-10-30 DIAGNOSIS — S39012A Strain of muscle, fascia and tendon of lower back, initial encounter: Secondary | ICD-10-CM | POA: Diagnosis present

## 2023-10-30 LAB — POCT URINE DIPSTICK
Bilirubin, UA: NEGATIVE
Glucose, UA: NEGATIVE mg/dL
Ketones, POC UA: NEGATIVE mg/dL
Leukocytes, UA: NEGATIVE
Nitrite, UA: NEGATIVE
POC PROTEIN,UA: NEGATIVE
Spec Grav, UA: <=1.005 — AB (ref 1.010–1.025)
Urobilinogen, UA: 0.2 U/dL
pH, UA: 6 (ref 5.0–8.0)

## 2023-10-30 LAB — POCT URINE PREGNANCY: Preg Test, Ur: NEGATIVE

## 2023-10-30 MED ORDER — BACLOFEN 10 MG PO TABS: 10.0000 mg | ORAL_TABLET | Freq: Three times a day (TID) | ORAL | 0 refills | Status: DC

## 2023-10-30 MED ORDER — METHYLPREDNISOLONE SODIUM SUCC 125 MG IJ SOLR
80.0000 mg | Freq: Once | INTRAMUSCULAR | Status: AC
  Administered 2023-10-30: 80 mg via INTRAMUSCULAR

## 2023-10-30 MED ORDER — KETOROLAC TROMETHAMINE 30 MG/ML IJ SOLN: 15.0000 mg | Freq: Once | INTRAMUSCULAR | Status: DC

## 2023-10-30 NOTE — ED Triage Notes (Signed)
 Pt presents for STD testing with blood work. Denies any symptoms.   Pt also dx with sickle cell and over last week she has been experiencing headaches left lower back pain, muscle pain which is similar to her last sickle crisis. States she wants to have hemoglobin checked .

## 2023-10-30 NOTE — ED Provider Notes (Signed)
 [facility]    CSN: [id number] Arrival date & time: 10/30/23  1108      History   Chief Complaint Chief Complaint  Patient presents with   SEXUALLY TRANSMITTED DISEASE    Entered by patient   Flank Pain   Headache    HPI Kimberly Roach is a 23 y.o. female.   Kimberly Roach is a 23 y.o. female presenting for chief complaint of STD testing, generalized fatigue for the last several months that has worsened over the last 2 to 3 weeks, and left-sided low back pain/left flank pain that started a few days ago. She is not currently experiencing any vaginal discharge, vaginal odor, vaginal itching, or urinary symptoms.  Denies known exposures to STDs.  She has been sexually active with 2 female partners unprotected in the last 1 to 2 months.   Left torso/low back pain is worsened by movement and bending/twisting, improves with heat and rest.  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 She was diagnosed with sickle cell anemia in 2022 and wonders if her left low back pain/recent dizziness with position changes and generalized fatigue could be related to her sickle cell anemia. She has not had a pain crisis since 2022. Denies chest pain, shortness of breath, palpitations, rashes, fever/chills, vision changes, or diffuse myalgias.  History of iron deficiency anemia, she has required blood transfusion in the past when she was first diagnosed in 2022. She does not currently take iron supplement medications.   LMP 06/28/2023, menstrual cycles are irregular, she's unsure of chance of pregnancy.    Flank Pain Associated symptoms include headaches.  Headache   Past Medical History:  Diagnosis Date   Anxiety    Depression    Sickle cell anemia (HCC)    Wears contact lenses     Patient Active Problem List   Diagnosis Date Noted   Gross hematuria 07/25/2020   Complex tear of lateral meniscus of right knee as  current injury    Right knee pain 09/14/2019   Marijuana abuse 12/31/2017   Other specified anxiety disorders 12/24/2017   MDD (major depressive disorder), recurrent episode, moderate (HCC) 12/08/2017   Other constipation 03/28/2016   Vaginal discharge 03/28/2016   Bacterial vaginosis 07/23/2014   Recurrent UTI 07/23/2014    Past Surgical History:  Procedure Laterality Date   KNEE ARTHROSCOPY Right 04/02/2020   Procedure: RIGHT KNEE ARTHROSCOPY, DEBRIDEMENT;  Surgeon: Addie Cordella Hamilton, MD;  Location: [facility];  Service: Orthopedics;  Laterality: Right;   KNEE ARTHROSCOPY WITH LATERAL MENISECTOMY Right 04/02/2020   Procedure: KNEE ARTHROSCOPY WITH LATERAL Partial MENISECTOMY;  Surgeon: Addie Cordella Hamilton, MD;  Location: [facility];  Service: Orthopedics;  Laterality: Right;   TONSILLECTOMY     TONSILLECTOMY AND ADENOIDECTOMY N/A 11/22/2014   Procedure: TONSILLECTOMY AND ADENOIDECTOMY;  Surgeon: Carolee Hunter, MD;  Location: [facility];  Service: ENT;  Laterality: N/A;  ADENOIDS CAUTERIZED NO TISSUE SENT FOR PATHOLOGY    OB History     Gravida  1   Para      Term      Preterm      AB  1   Living         SAB  1   IAB      Ectopic      Multiple      Live Births  Home Medications    Prior to Admission medications   Medication Sig Start Date End Date Taking? Authorizing Provider  baclofen (LIORESAL) 10 MG tablet Take 1 tablet (10 mg total) by mouth 3 (three) times daily. 10/30/23  Yes Enedelia Dorna HERO, FNP  erythromycin  ophthalmic ointment Place a 1/2 inch ribbon of ointment into the lower eyelid. Patient not taking: Reported on 10/30/2023 08/20/23   Melonie Locus, PA-C  medroxyPROGESTERone  (DEPO-PROVERA ) 150 MG/ML injection Inject 1 mL (150 mg total) into the muscle every 3 (three) months. Patient not taking: Reported on 10/30/2023 06/04/23   Eveline Lynwood MATSU, MD  albuterol  (PROVENTIL ) (2.5 MG/3ML)  0.083% nebulizer solution Take 3 mLs (2.5 mg total) by nebulization every 6 (six) hours as needed for wheezing or shortness of breath. 03/12/18 11/06/18  Burky, Natalie B, NP  ipratropium (ATROVENT ) 0.06 % nasal spray Place 2 sprays into both nostrils 3 (three) times daily as needed for rhinitis. 11/06/18 11/25/18  Tellis Laneta NOVAK, NP    Family History Family History  Problem Relation Age of Onset   Healthy Mother    Anxiety disorder Father    Depression Father    Healthy Father     Social History Social History   Tobacco Use   Smoking status: Never   Smokeless tobacco: Never  Vaping Use   Vaping status: Former  Substance Use Topics   Alcohol use: Yes    Comment: Ocasionally   Drug use: Yes    Types: Marijuana     Allergies   Patient has no known allergies.   Review of Systems Review of Systems  Genitourinary:  Positive for flank pain.  Neurological:  Positive for headaches.  Per HPI   Physical Exam Triage Vital Signs ED Triage Vitals [10/30/23 1140]  Encounter Vitals Group     BP 121/72     Girls Systolic BP Percentile      Girls Diastolic BP Percentile      Boys Systolic BP Percentile      Boys Diastolic BP Percentile      Pulse Rate 84     Resp 17     Temp 99.1 F (37.3 C)     Temp Source Oral     SpO2 96 %     Weight      Height      Head Circumference      Peak Flow      Pain Score 3     Pain Loc      Pain Education      Exclude from Growth Chart    No data found.  Updated Vital Signs BP 121/72 (BP Location: Right Arm)   Pulse 84   Temp 99.1 F (37.3 C) (Oral)   Resp 17   LMP 06/28/2023 Comment: did get a depo shot in June, but never went back for consecutive injections  SpO2 96%   Visual Acuity Right Eye Distance:   Left Eye Distance:   Bilateral Distance:    Right Eye Near:   Left Eye Near:    Bilateral Near:     Physical Exam Vitals and nursing note reviewed.  Constitutional:      Appearance: She is not ill-appearing or  toxic-appearing.  HENT:     Head: Normocephalic and atraumatic.     Jaw: There is normal jaw occlusion.     Right Ear: Hearing, tympanic membrane, ear canal and external ear normal.     Left Ear: Hearing, tympanic membrane, ear canal and external  ear normal.     Nose: Nose normal.     Mouth/Throat:     Lips: Pink.     Mouth: Mucous membranes are moist. No injury or oral lesions.     Dentition: Normal dentition.     Tongue: No lesions.     Pharynx: Oropharynx is clear. Uvula midline. No pharyngeal swelling, oropharyngeal exudate, posterior oropharyngeal erythema, uvula swelling or postnasal drip.     Tonsils: No tonsillar exudate.  Eyes:     General: Lids are normal. Vision grossly intact. Gaze aligned appropriately.     Extraocular Movements: Extraocular movements intact.     Conjunctiva/sclera: Conjunctivae normal.  Neck:     Trachea: Trachea and phonation normal.  Cardiovascular:     Rate and Rhythm: Normal rate and regular rhythm.     Heart sounds: Normal heart sounds, S1 normal and S2 normal.  Pulmonary:     Effort: Pulmonary effort is normal. No respiratory distress.     Breath sounds: Normal breath sounds and air entry.  Abdominal:     General: Abdomen is flat.     Palpations: Abdomen is soft.     Tenderness: There is no abdominal tenderness. There is no right CVA tenderness or left CVA tenderness.  Musculoskeletal:     Cervical back: Normal and neck supple.     Thoracic back: Normal.     Lumbar back: Tenderness (Left lumbar paraspinous muscle pain) present. No swelling, edema, deformity, signs of trauma, lacerations, spasms or bony tenderness. Normal range of motion. Negative right straight leg raise test and negative left straight leg raise test. No scoliosis.       Back:  Lymphadenopathy:     Cervical: No cervical adenopathy.  Skin:    General: Skin is warm and dry.     Capillary Refill: Capillary refill takes less than 2 seconds.     Coloration: Skin is not pale.      Findings: No rash.  Neurological:     General: No focal deficit present.     Mental Status: She is alert and oriented to person, place, and time. Mental status is at baseline.     Cranial Nerves: No dysarthria or facial asymmetry.  Psychiatric:        Mood and Affect: Mood normal.        Speech: Speech normal.        Behavior: Behavior normal.        Thought Content: Thought content normal.        Judgment: Judgment normal.      UC Treatments / Results  Labs (all labs ordered are listed, but only abnormal results are displayed) Labs Reviewed  POCT URINE DIPSTICK - Abnormal; Notable for the following components:      Result Value   Spec Grav, UA <=1.005 (*)    Blood, UA trace-intact (*)    All other components within normal limits  COMPREHENSIVE METABOLIC PANEL WITH GFR  CBC WITH DIFFERENTIAL/PLATELET  RPR  HIV ANTIBODY (ROUTINE TESTING W REFLEX)  POCT URINE PREGNANCY  CERVICOVAGINAL ANCILLARY ONLY    EKG   Radiology No results found.  Procedures Procedures (including critical care time)  Medications Ordered in UC Medications  methylPREDNISolone  sodium succinate (SOLU-MEDROL ) 125 mg/2 mL injection 80 mg (80 mg Intramuscular Given 10/30/23 1213)    Initial Impression / Assessment and Plan / UC Course  I have reviewed the triage vital signs and the nursing notes.  Pertinent labs & imaging results that were available during  my care of the patient were reviewed by me and considered in my medical decision making (see chart for details).   1. Other fatigue, sickle cell disease without crisis CBC with diff and CMP drawn to evaluate for anemia/other causes of fatigue. Low suspicion for acute sickle cell crisis at this time/acute chest. She is well appearing with hemodynamically stable vital signs.  Recommend follow-up with PCP for ongoing evaluation and management.   2. Strain of lumbar region Evaluation suggests pain is musculoskeletal in nature. Will manage this  with rest, gentle ROM exercises, heat therapy, tylenol  as needed for pain, and as needed use of muscle relaxer. Drowsiness precautions discussed regarding muscle relaxer use. Solumedrol 80mg  IM given for acute pain/inflammation, she is not a candidate for NSAID.  Imaging: no indication for imaging based on stable musculoskeletal exam findings May follow-up with orthopedics as needed. Work/school excise note given.  3. At risk for STD, screening for STD STI labs pending, will notify patient of positive results and treat accordingly per protocol when labs result.  Patient would like HIV and syphilis testing today.   Patient to avoid sexual intercourse until screening testing comes back.   Education provided regarding safe sexual practices and patient encouraged to use protection to prevent spread of STIs.   Urine pregnancy test negative.  Counseled patient on potential for adverse effects with medications prescribed/recommended today, strict ER and return-to-clinic precautions discussed, patient verbalized understanding.    Final Clinical Impressions(s) / UC Diagnoses   Final diagnoses:  Other fatigue  Screening for STD (sexually transmitted disease)  At risk for sexually transmitted disease due to unprotected sex  Strain of lumbar region, initial encounter  Sickle cell disease without crisis [facility])     Discharge Instructions      Your back pain is likely due to a muscle strain which will improve on its own with time.   We gave you steroid injection in the clinic today.   You may take tylenol  as needed for aches and pains.  Take muscle relaxer as needed for muscle spasm, mostly take this at bedtime as this medicine can cause drowsiness.  Apply heat to the pulled muscle 20 minutes on 20 minutes off as needed, heat relaxes muscles.  Perform gentle exercises and stretches to area of tenderness.  I would like for you to rest, however I do not want you to avoid moving the area.  Movement and stretching will help with healing.  Red flag symptoms to watch out for are numbness/tingling to the legs, weakness, loss of bowel/bladder control, and/or worsening pain that does not respond well to medicines.  Follow-up with your primary care provider or return to urgent care if your symptoms do not improve in the next 3 to 4 days with medications and interventions recommended today. If your symptoms are severe (red flag), please go to the emergency room.     STD testing pending, this will take 2-3 days to result. We will only call you if your testing is positive for any infection(s) and we will provide treatment.  Avoid sexual intercourse until your STD results come back.  If any of your STD results are positive, you will need to avoid sexual intercourse for 7 days while you are being treated to prevent spread of STD.  Condom use is the best way to prevent spread of STDs. Notify partner(s) of any positive results.  Return to urgent care as needed.       ED Prescriptions  Medication Sig Dispense Auth. Provider   baclofen (LIORESAL) 10 MG tablet Take 1 tablet (10 mg total) by mouth 3 (three) times daily. 30 each Enedelia Dorna HERO, FNP      PDMP not reviewed this encounter.   Enedelia Dorna HERO, [place] 10/30/23 1331

## 2023-10-30 NOTE — Discharge Instructions (Addendum)
 Your back pain is likely due to a muscle strain which will improve on its own with time.   We gave you steroid injection in the clinic today.   You may take tylenol  as needed for aches and pains.  Take muscle relaxer as needed for muscle spasm, mostly take this at bedtime as this medicine can cause drowsiness.  Apply heat to the pulled muscle 20 minutes on 20 minutes off as needed, heat relaxes muscles.  Perform gentle exercises and stretches to area of tenderness.  I would like for you to rest, however I do not want you to avoid moving the area. Movement and stretching will help with healing.  Red flag symptoms to watch out for are numbness/tingling to the legs, weakness, loss of bowel/bladder control, and/or worsening pain that does not respond well to medicines.  Follow-up with your primary care provider or return to urgent care if your symptoms do not improve in the next 3 to 4 days with medications and interventions recommended today. If your symptoms are severe (red flag), please go to the emergency room.     STD testing pending, this will take 2-3 days to result. We will only call you if your testing is positive for any infection(s) and we will provide treatment.  Avoid sexual intercourse until your STD results come back.  If any of your STD results are positive, you will need to avoid sexual intercourse for 7 days while you are being treated to prevent spread of STD.  Condom use is the best way to prevent spread of STDs. Notify partner(s) of any positive results.  Return to urgent care as needed.

## 2023-10-31 LAB — CBC WITH DIFFERENTIAL/PLATELET
Basophils Absolute: 0 x10E3/uL (ref 0.0–0.2)
Basos: 1 %
EOS (ABSOLUTE): 0.1 x10E3/uL (ref 0.0–0.4)
Eos: 3 %
Hematocrit: 43.5 % (ref 34.0–46.6)
Hemoglobin: 14.6 g/dL (ref 11.1–15.9)
Immature Grans (Abs): 0 x10E3/uL (ref 0.0–0.1)
Immature Granulocytes: 0 %
Lymphocytes Absolute: 2.2 x10E3/uL (ref 0.7–3.1)
Lymphs: 46 %
MCH: 30.6 pg (ref 26.6–33.0)
MCHC: 33.6 g/dL (ref 31.5–35.7)
MCV: 91 fL (ref 79–97)
Monocytes Absolute: 0.3 x10E3/uL (ref 0.1–0.9)
Monocytes: 7 %
Neutrophils Absolute: 2.1 x10E3/uL (ref 1.4–7.0)
Neutrophils: 43 %
Platelets: 324 x10E3/uL (ref 150–450)
RBC: 4.77 x10E6/uL (ref 3.77–5.28)
RDW: 12.9 % (ref 11.7–15.4)
WBC: 4.8 x10E3/uL (ref 3.4–10.8)

## 2023-10-31 LAB — COMPREHENSIVE METABOLIC PANEL WITH GFR
ALT: 18 IU/L (ref 0–32)
AST: 22 IU/L (ref 0–40)
Albumin: 4.5 g/dL (ref 4.0–5.0)
Alkaline Phosphatase: 58 IU/L (ref 41–116)
BUN/Creatinine Ratio: 10 (ref 9–23)
BUN: 7 mg/dL (ref 6–20)
Bilirubin Total: 1.2 mg/dL (ref 0.0–1.2)
CO2: 18 mmol/L — ABNORMAL LOW (ref 20–29)
Calcium: 9.1 mg/dL (ref 8.7–10.2)
Chloride: 109 mmol/L — ABNORMAL HIGH (ref 96–106)
Creatinine, Ser: 0.72 mg/dL (ref 0.57–1.00)
Globulin, Total: 2.2 g/dL (ref 1.5–4.5)
Glucose: 122 mg/dL — ABNORMAL HIGH (ref 70–99)
Potassium: 3.7 mmol/L (ref 3.5–5.2)
Sodium: 143 mmol/L (ref 134–144)
Total Protein: 6.7 g/dL (ref 6.0–8.5)
eGFR: 120 mL/min/1.73 (ref >59)

## 2023-10-31 LAB — RPR: RPR Ser Ql: NONREACTIVE

## 2023-10-31 LAB — HIV ANTIBODY (ROUTINE TESTING W REFLEX): HIV Screen 4th Generation wRfx: NONREACTIVE

## 2023-11-02 ENCOUNTER — Ambulatory Visit (HOSPITAL_COMMUNITY): Payer: Self-pay

## 2023-11-02 LAB — CERVICOVAGINAL ANCILLARY ONLY
Chlamydia: POSITIVE — AB
Comment: NEGATIVE
Comment: NEGATIVE
Comment: NORMAL
Neisseria Gonorrhea: NEGATIVE
Trichomonas: NEGATIVE

## 2023-11-03 ENCOUNTER — Telehealth: Payer: Self-pay | Admitting: Physician Assistant

## 2023-11-03 MED ORDER — DOXYCYCLINE HYCLATE 100 MG PO CAPS: 100.0000 mg | ORAL_CAPSULE | Freq: Two times a day (BID) | ORAL | 0 refills | Status: AC

## 2023-11-03 NOTE — Telephone Encounter (Signed)
 Cytology swab results positive for Chlamydia. Will send in script for doxycycline  100 mg PO BID x 7days for management. Recommend refraining from sex until abx is completed and partners have been tested/treated appropriately as well.

## 2023-11-11 ENCOUNTER — Ambulatory Visit (HOSPITAL_COMMUNITY): Payer: Self-pay

## 2023-11-17 ENCOUNTER — Other Ambulatory Visit: Payer: Self-pay

## 2023-11-17 ENCOUNTER — Ambulatory Visit
Admission: RE | Admit: 2023-11-17 | Discharge: 2023-11-17 | Disposition: A | Discharge status: Home / Self Care | Attending: Physician Assistant | Admitting: Physician Assistant

## 2023-11-17 VITALS — BP 115/76 | HR 89 | Temp 98.3°F | Resp 16 | Ht 61.0 in | Wt 130.0 lb

## 2023-11-17 DIAGNOSIS — J069 Acute upper respiratory infection, unspecified: Secondary | ICD-10-CM | POA: Insufficient documentation

## 2023-11-17 DIAGNOSIS — Z113 Encounter for screening for infections with a predominantly sexual mode of transmission: Secondary | ICD-10-CM | POA: Diagnosis present

## 2023-11-17 DIAGNOSIS — R0989 Other specified symptoms and signs involving the circulatory and respiratory systems: Secondary | ICD-10-CM | POA: Insufficient documentation

## 2023-11-17 LAB — POCT URINE PREGNANCY: Preg Test, Ur: NEGATIVE

## 2023-11-17 LAB — POC SOFIA SARS ANTIGEN FIA: SARS Coronavirus 2 Ag: NEGATIVE

## 2023-11-17 LAB — POCT RAPID STREP A (OFFICE): Rapid Strep A Screen: NEGATIVE

## 2023-11-17 MED ORDER — ALBUTEROL SULFATE HFA 108 (90 BASE) MCG/ACT IN AERS: 1.0000 | INHALATION_SPRAY | Freq: Four times a day (QID) | RESPIRATORY_TRACT | 0 refills | Status: AC | PRN

## 2023-11-17 NOTE — ED Triage Notes (Signed)
 Pt presents with a chief complaint of sore throat x 2 days. Sore throat has been accompanied with nasal congestion, cough, and headaches. OTC Dayquil + Nyquil taken at home with no improvement/relief. Currently rates overall throat pain a 3/10. No fevers. Denies sick contacts.

## 2023-11-17 NOTE — ED Provider Notes (Signed)
 [facility]    CSN: [id number] Arrival date & time: 11/17/23  1424      History   Chief Complaint Chief Complaint  Patient presents with   Sore Throat    Entered by patient   SEXUALLY TRANSMITTED DISEASE    Pt would also like cytology swab today. No symptoms. Denies wanting blood work.     HPI Kimberly Roach is a 23 y.o. female.  has a past medical history of Anxiety, Depression, Sickle cell anemia (HCC), and Wears contact lenses.   HPI  Discussed the use of AI scribe software for clinical note transcription with the patient, who gave verbal consent to proceed.  The patient presents with sore throat and sinus symptoms.  She has been experiencing a sore throat since Sunday, which is the most severe symptom. This is accompanied by sinus pressure and a headache localized to the face. She also has a dry cough that worsens at night and nasal congestion that is particularly bothersome at night, causing her to wake up with one nostril completely blocked. The congestion improves slightly when she sits up but returns shortly after.  She has experienced chills but no fever. She tries to spit up mucus but nothing comes up. She has been using DayQuil for symptom relief but reports it has not been effective, as she continues to wake up feeling the same.  No ear pain, nausea, vomiting, diarrhea, rashes, dizziness, or dysuria. She has a history of bronchitis, during which she was prescribed an inhaler, but she has not used an inhaler since then.  She is not currently on any birth control, having last received a Depo shot in June, with spotting in July and no periods since. She denies any recent travel or contact with individuals with similar symptoms.   Past Medical History:  Diagnosis Date   Anxiety    Depression    Sickle cell anemia (HCC)    Wears contact lenses     Patient Active Problem List   Diagnosis Date Noted   Gross hematuria 07/25/2020   Complex tear of  lateral meniscus of right knee as current injury    Right knee pain 09/14/2019   Marijuana abuse 12/31/2017   Other specified anxiety disorders 12/24/2017   MDD (major depressive disorder), recurrent episode, moderate (HCC) 12/08/2017   Other constipation 03/28/2016   Vaginal discharge 03/28/2016   Bacterial vaginosis 07/23/2014   Recurrent UTI 07/23/2014    Past Surgical History:  Procedure Laterality Date   KNEE ARTHROSCOPY Right 04/02/2020   Procedure: RIGHT KNEE ARTHROSCOPY, DEBRIDEMENT;  Surgeon: Addie Cordella Hamilton, MD;  Location: [facility];  Service: Orthopedics;  Laterality: Right;   KNEE ARTHROSCOPY WITH LATERAL MENISECTOMY Right 04/02/2020   Procedure: KNEE ARTHROSCOPY WITH LATERAL Partial MENISECTOMY;  Surgeon: Addie Cordella Hamilton, MD;  Location: [facility];  Service: Orthopedics;  Laterality: Right;   TONSILLECTOMY     TONSILLECTOMY AND ADENOIDECTOMY N/A 11/22/2014   Procedure: TONSILLECTOMY AND ADENOIDECTOMY;  Surgeon: Carolee Hunter, MD;  Location: [facility];  Service: ENT;  Laterality: N/A;  ADENOIDS CAUTERIZED NO TISSUE SENT FOR PATHOLOGY    OB History     Gravida  1   Para      Term      Preterm      AB  1   Living         SAB  1   IAB      Ectopic      Multiple  Live Births               Home Medications    Prior to Admission medications   Medication Sig Start Date End Date Taking? Authorizing Provider  albuterol  (VENTOLIN  HFA) 108 (90 Base) MCG/ACT inhaler Inhale 1-2 puffs into the lungs every 6 (six) hours as needed for wheezing or shortness of breath. 11/17/23  Yes Ahmaud Duthie E, PA-C  baclofen (LIORESAL) 10 MG tablet Take 1 tablet (10 mg total) by mouth 3 (three) times daily. 10/30/23   Enedelia Dorna HERO, FNP  erythromycin  ophthalmic ointment Place a 1/2 inch ribbon of ointment into the lower eyelid. Patient not taking: Reported on 10/30/2023 08/20/23   Melonie Locus, PA-C   medroxyPROGESTERone  (DEPO-PROVERA ) 150 MG/ML injection Inject 1 mL (150 mg total) into the muscle every 3 (three) months. Patient not taking: Reported on 10/30/2023 06/04/23   Arnold, James G, MD  ipratropium (ATROVENT ) 0.06 % nasal spray Place 2 sprays into both nostrils 3 (three) times daily as needed for rhinitis. 11/06/18 11/25/18  Tellis Laneta NOVAK, NP    Family History Family History  Problem Relation Age of Onset   Healthy Mother    Anxiety disorder Father    Depression Father    Healthy Father     Social History Social History   Tobacco Use   Smoking status: Never   Smokeless tobacco: Never  Vaping Use   Vaping status: Former  Substance Use Topics   Alcohol use: Yes    Comment: Ocasionally   Drug use: Yes    Types: Marijuana     Allergies   Patient has no known allergies.   Review of Systems Review of Systems  Constitutional:  Positive for chills. Negative for fever.  HENT:  Positive for congestion, postnasal drip, rhinorrhea, sinus pressure, sinus pain and sore throat. Negative for ear pain.   Respiratory:  Positive for cough. Negative for chest tightness, shortness of breath and wheezing.   Gastrointestinal:  Negative for diarrhea, nausea and vomiting.  Genitourinary:  Negative for dysuria, vaginal bleeding, vaginal discharge and vaginal pain.  Musculoskeletal:  Positive for myalgias. Negative for arthralgias.  Skin:  Negative for rash.  Neurological:  Positive for headaches. Negative for dizziness and light-headedness.     Physical Exam Triage Vital Signs ED Triage Vitals  Encounter Vitals Group     BP 11/17/23 1444 115/76     Girls Systolic BP Percentile --      Girls Diastolic BP Percentile --      Boys Systolic BP Percentile --      Boys Diastolic BP Percentile --      Pulse Rate 11/17/23 1444 89     Resp 11/17/23 1444 16     Temp 11/17/23 1444 98.3 F (36.8 C)     Temp Source 11/17/23 1444 Oral     SpO2 11/17/23 1444 97 %     Weight 11/17/23  1442 130 lb (59 kg)     Height 11/17/23 1442 5' 1 (1.549 m)     Head Circumference --      Peak Flow --      Pain Score 11/17/23 1442 3     Pain Loc --      Pain Education --      Exclude from Growth Chart --    No data found.  Updated Vital Signs BP 115/76 (BP Location: Right Arm)   Pulse 89   Temp 98.3 F (36.8 C) (Oral)   Resp 16  Ht 5' 1 (1.549 m)   Wt 130 lb (59 kg)   LMP 06/28/2023 Comment: did get a depo shot in June, but never went back for consecutive injections  SpO2 97%   BMI 24.56 kg/m   Visual Acuity Right Eye Distance:   Left Eye Distance:   Bilateral Distance:    Right Eye Near:   Left Eye Near:    Bilateral Near:     Physical Exam Vitals reviewed.  Constitutional:      General: She is awake.     Appearance: Normal appearance. She is well-developed and well-groomed.  HENT:     Head: Normocephalic and atraumatic.     Right Ear: Hearing, tympanic membrane and ear canal normal.     Left Ear: Hearing, tympanic membrane and ear canal normal.     Mouth/Throat:     Lips: Pink.     Mouth: Mucous membranes are moist.     Pharynx: Oropharynx is clear. Uvula midline. Posterior oropharyngeal erythema and postnasal drip present. No pharyngeal swelling, oropharyngeal exudate or uvula swelling.  Cardiovascular:     Rate and Rhythm: Normal rate and regular rhythm.     Pulses: Normal pulses.          Radial pulses are 2+ on the right side and 2+ on the left side.     Heart sounds: Normal heart sounds. No murmur heard.    No friction rub. No gallop.  Pulmonary:     Effort: Pulmonary effort is normal.     Breath sounds: Normal breath sounds. No decreased air movement. No decreased breath sounds, wheezing, rhonchi or rales.  Musculoskeletal:     Cervical back: Normal range of motion and neck supple.  Lymphadenopathy:     Head:     Right side of head: No submental, submandibular or preauricular adenopathy.     Left side of head: No submental, submandibular or  preauricular adenopathy.     Cervical:     Right cervical: No superficial cervical adenopathy.    Left cervical: No superficial cervical adenopathy.     Upper Body:     Right upper body: No supraclavicular adenopathy.     Left upper body: No supraclavicular adenopathy.  Neurological:     Mental Status: She is alert.  Psychiatric:        Behavior: Behavior is cooperative.      UC Treatments / Results  Labs (all labs ordered are listed, but only abnormal results are displayed) Labs Reviewed  POCT URINE PREGNANCY - Normal  POCT RAPID STREP A (OFFICE)  POC SOFIA SARS ANTIGEN FIA  CERVICOVAGINAL ANCILLARY ONLY    EKG   Radiology No results found.  Procedures Procedures (including critical care time)  Medications Ordered in UC Medications - No data to display  Initial Impression / Assessment and Plan / UC Course  I have reviewed the triage vital signs and the nursing notes.  Pertinent labs & imaging results that were available during my care of the patient were reviewed by me and considered in my medical decision making (see chart for details).      Final Clinical Impressions(s) / UC Diagnoses   Final diagnoses:  Symptoms of upper respiratory infection (URI)  Screening examination for STD (sexually transmitted disease)   Acute upper respiratory infection Symptoms began on Sunday, including sore throat, dry cough worsening at night, sinus pressure, and nasal congestion. Negative COVID-19 and strep tests. No fever, but chills present. No ear pain, nausea, vomiting, diarrhea, or  rashes. History of bronchitis, but no current asthma or breathing difficulties. Lungs clear on examination. Throat irritation present, but no swelling or white patches suggestive of strep. Differential includes reactive airway component due to nighttime cough. - Add antihistamines such as Claritin or Zyrtec  for sinus symptoms. - Use Flonase for nasal congestion and rhinorrhea. - Prescribe an  inhaler for nighttime breathing difficulties.  Patient would also like STD screening today.  She would like cervicovaginal swab to assess for BV, yeast, trichomoniasis, gonorrhea, chlamydia.  Swab collected.  Reviewed that she will be contacted with any positive results as well as management plan discussed at that time if appropriate and indicated.  Pregnancy test negative.  Follow-up as needed/indicated by test results.   Discharge Instructions      VISIT SUMMARY:  You came in today with a sore throat and sinus symptoms that started on Sunday. You have been experiencing sinus pressure, a headache, a dry cough that worsens at night, and nasal congestion. You have had chills but no fever. Your COVID-19 and strep tests were negative.  YOUR PLAN:  -ACUTE UPPER RESPIRATORY INFECTION: An acute upper respiratory infection is a viral infection that affects your nose, throat, and airways. To help with your symptoms, you should start taking antihistamines like Claritin or Zyrtec  for your sinus symptoms. Use Flonase for your nasal congestion and runny nose. An inhaler has been prescribed to help with your nighttime breathing difficulties.   STD screening: Please collected a cervicovaginal swab to assess for gonorrhea, chlamydia, BV, yeast, trichomonas.  You will be contacted with those results once they are available especially if anything is positive and you need to complete a medication regimen to treat it.  A pregnancy test will also be performed.  INSTRUCTIONS:  Please follow up if your symptoms do not improve or if they worsen. Use the prescribed medications as directed and rest as much as possible. A work note has been provided for today.     ED Prescriptions     Medication Sig Dispense Auth. Provider   albuterol  (VENTOLIN  HFA) 108 (90 Base) MCG/ACT inhaler Inhale 1-2 puffs into the lungs every 6 (six) hours as needed for wheezing or shortness of breath. 8 g Lyndia Bury E, PA-C      PDMP  not reviewed this encounter.   Marylene Rocky BRAVO, PA-C 11/17/23 [id number]

## 2023-11-17 NOTE — Discharge Instructions (Addendum)
 VISIT SUMMARY:  You came in today with a sore throat and sinus symptoms that started on Sunday. You have been experiencing sinus pressure, a headache, a dry cough that worsens at night, and nasal congestion. You have had chills but no fever. Your COVID-19 and strep tests were negative.  YOUR PLAN:  -ACUTE UPPER RESPIRATORY INFECTION: An acute upper respiratory infection is a viral infection that affects your nose, throat, and airways. To help with your symptoms, you should start taking antihistamines like Claritin or Zyrtec  for your sinus symptoms. Use Flonase for your nasal congestion and runny nose. An inhaler has been prescribed to help with your nighttime breathing difficulties.   STD screening: Please collected a cervicovaginal swab to assess for gonorrhea, chlamydia, BV, yeast, trichomonas.  You will be contacted with those results once they are available especially if anything is positive and you need to complete a medication regimen to treat it.  A pregnancy test will also be performed.  INSTRUCTIONS:  Please follow up if your symptoms do not improve or if they worsen. Use the prescribed medications as directed and rest as much as possible. A work note has been provided for today.

## 2023-11-18 LAB — CERVICOVAGINAL ANCILLARY ONLY
Bacterial Vaginitis (gardnerella): NEGATIVE
Candida Glabrata: NEGATIVE
Candida Vaginitis: NEGATIVE
Chlamydia: NEGATIVE
Comment: NEGATIVE
Comment: NEGATIVE
Comment: NEGATIVE
Comment: NEGATIVE
Comment: NEGATIVE
Comment: NORMAL
Neisseria Gonorrhea: NEGATIVE
Trichomonas: NEGATIVE

## 2023-12-24 ENCOUNTER — Inpatient Hospital Stay: Admission: RE | Admit: 2023-12-24 | Discharge: 2023-12-24 | Attending: Family Medicine

## 2023-12-24 ENCOUNTER — Other Ambulatory Visit: Payer: Self-pay

## 2023-12-24 VITALS — BP 112/77 | HR 75 | Temp 98.2°F | Resp 16

## 2023-12-24 DIAGNOSIS — N3001 Acute cystitis with hematuria: Secondary | ICD-10-CM | POA: Insufficient documentation

## 2023-12-24 DIAGNOSIS — R300 Dysuria: Secondary | ICD-10-CM | POA: Diagnosis present

## 2023-12-24 DIAGNOSIS — Z113 Encounter for screening for infections with a predominantly sexual mode of transmission: Secondary | ICD-10-CM | POA: Insufficient documentation

## 2023-12-24 LAB — POCT URINE DIPSTICK
Bilirubin, UA: NEGATIVE
Glucose, UA: NEGATIVE mg/dL
Ketones, POC UA: NEGATIVE mg/dL
Nitrite, UA: NEGATIVE
POC PROTEIN,UA: NEGATIVE
Spec Grav, UA: 1.02 (ref 1.010–1.025)
Urobilinogen, UA: 0.2 U/dL
pH, UA: 5.5 (ref 5.0–8.0)

## 2023-12-24 LAB — POCT URINE PREGNANCY: Preg Test, Ur: NEGATIVE

## 2023-12-24 MED ORDER — NITROFURANTOIN MONOHYD MACRO 100 MG PO CAPS: 100.0000 mg | ORAL_CAPSULE | Freq: Two times a day (BID) | ORAL | 0 refills | Status: DC

## 2023-12-24 NOTE — ED Provider Notes (Signed)
 [facility]    CSN: [id number] Arrival date & time: 12/24/23  0903      History   Chief Complaint Chief Complaint  Patient presents with   Urinary Frequency    Entered by patient    HPI Kimberly Roach is a 23 y.o. female presents for urinary symptoms.  Patient reports 3 days of urinary frequency with malodorous urine and suprapubic cramping/pressure.  Denies burning with urination, urgency, hematuria, fevers, vomiting, or flank pain.  No vaginal discharge or STD concern but would like screening while here.  No OTC treatments have been used since onset.  No other concerns at this time.   Urinary Frequency    Past Medical History:  Diagnosis Date   Anxiety    Depression    Sickle cell anemia (HCC)    Wears contact lenses     Patient Active Problem List   Diagnosis Date Noted   Gross hematuria 07/25/2020   Complex tear of lateral meniscus of right knee as current injury    Right knee pain 09/14/2019   Marijuana abuse 12/31/2017   Other specified anxiety disorders 12/24/2017   MDD (major depressive disorder), recurrent episode, moderate (HCC) 12/08/2017   Other constipation 03/28/2016   Vaginal discharge 03/28/2016   Bacterial vaginosis 07/23/2014   Recurrent UTI 07/23/2014    Past Surgical History:  Procedure Laterality Date   KNEE ARTHROSCOPY Right 04/02/2020   Procedure: RIGHT KNEE ARTHROSCOPY, DEBRIDEMENT;  Surgeon: Addie Cordella Hamilton, MD;  Location: [facility];  Service: Orthopedics;  Laterality: Right;   KNEE ARTHROSCOPY WITH LATERAL MENISECTOMY Right 04/02/2020   Procedure: KNEE ARTHROSCOPY WITH LATERAL Partial MENISECTOMY;  Surgeon: Addie Cordella Hamilton, MD;  Location: [facility];  Service: Orthopedics;  Laterality: Right;   TONSILLECTOMY     TONSILLECTOMY AND ADENOIDECTOMY N/A 11/22/2014   Procedure: TONSILLECTOMY AND ADENOIDECTOMY;  Surgeon: Carolee Hunter, MD;  Location: [facility];  Service: ENT;   Laterality: N/A;  ADENOIDS CAUTERIZED NO TISSUE SENT FOR PATHOLOGY    OB History     Gravida  1   Para      Term      Preterm      AB  1   Living         SAB  1   IAB      Ectopic      Multiple      Live Births               Home Medications    Prior to Admission medications   Medication Sig Start Date End Date Taking? Authorizing Provider  nitrofurantoin , macrocrystal-monohydrate, (MACROBID ) 100 MG capsule Take 1 capsule (100 mg total) by mouth 2 (two) times daily. 12/24/23  Yes Rabon Scholle, Jodi R, NP  albuterol  (VENTOLIN  HFA) 108 (90 Base) MCG/ACT inhaler Inhale 1-2 puffs into the lungs every 6 (six) hours as needed for wheezing or shortness of breath. 11/17/23   Mecum, Erin E, PA-C  baclofen  (LIORESAL ) 10 MG tablet Take 1 tablet (10 mg total) by mouth 3 (three) times daily. 10/30/23   Enedelia Dorna HERO, FNP  erythromycin  ophthalmic ointment Place a 1/2 inch ribbon of ointment into the lower eyelid. Patient not taking: Reported on 10/30/2023 08/20/23   Melonie Locus, PA-C  medroxyPROGESTERone  (DEPO-PROVERA ) 150 MG/ML injection Inject 1 mL (150 mg total) into the muscle every 3 (three) months. Patient not taking: Reported on 10/30/2023 06/04/23   Arnold, James G, MD  ipratropium (ATROVENT ) 0.06 % nasal  spray Place 2 sprays into both nostrils 3 (three) times daily as needed for rhinitis. 11/06/18 11/25/18  Tellis Laneta NOVAK, NP    Family History Family History  Problem Relation Age of Onset   Healthy Mother    Anxiety disorder Father    Depression Father    Healthy Father     Social History Social History   Tobacco Use   Smoking status: Never   Smokeless tobacco: Never  Vaping Use   Vaping status: Former  Substance Use Topics   Alcohol use: Yes    Comment: Ocasionally   Drug use: Yes    Types: Marijuana     Allergies   Patient has no known allergies.   Review of Systems Review of Systems  Genitourinary:  Positive for frequency.      Physical Exam Triage Vital Signs ED Triage Vitals  Encounter Vitals Group     BP 12/24/23 0910 112/77     Girls Systolic BP Percentile --      Girls Diastolic BP Percentile --      Boys Systolic BP Percentile --      Boys Diastolic BP Percentile --      Pulse Rate 12/24/23 0910 75     Resp 12/24/23 0910 16     Temp 12/24/23 0910 98.2 F (36.8 C)     Temp Source 12/24/23 0910 Oral     SpO2 12/24/23 0910 97 %     Weight --      Height --      Head Circumference --      Peak Flow --      Pain Score 12/24/23 0908 6     Pain Loc --      Pain Education --      Exclude from Growth Chart --    No data found.  Updated Vital Signs BP 112/77   Pulse 75   Temp 98.2 F (36.8 C) (Oral)   Resp 16   LMP 12/03/2023   SpO2 97%   Visual Acuity Right Eye Distance:   Left Eye Distance:   Bilateral Distance:    Right Eye Near:   Left Eye Near:    Bilateral Near:     Physical Exam Vitals and nursing note reviewed.  Constitutional:      Appearance: Normal appearance.  HENT:     Head: Normocephalic and atraumatic.  Eyes:     Pupils: Pupils are equal, round, and reactive to light.  Cardiovascular:     Rate and Rhythm: Normal rate.  Pulmonary:     Effort: Pulmonary effort is normal.  Skin:    General: Skin is warm and dry.  Neurological:     General: No focal deficit present.     Mental Status: She is alert and oriented to person, place, and time.  Psychiatric:        Mood and Affect: Mood normal.        Behavior: Behavior normal.      UC Treatments / Results  Labs (all labs ordered are listed, but only abnormal results are displayed) Labs Reviewed  POCT URINE DIPSTICK - Abnormal; Notable for the following components:      Result Value   Clarity, UA cloudy (*)    Blood, UA small (*)    Leukocytes, UA Small (1+) (*)    All other components within normal limits  URINE CULTURE  POCT URINE PREGNANCY  CERVICOVAGINAL ANCILLARY ONLY    EKG   Radiology No  results found.  Procedures Procedures (including critical care time)  Medications Ordered in UC Medications - No data to display  Initial Impression / Assessment and Plan / UC Course  I have reviewed the triage vital signs and the nursing notes.  Pertinent labs & imaging results that were available during my care of the patient were reviewed by me and considered in my medical decision making (see chart for details).     Reviewed exam and symptoms with patient.  Urine positive for UTI, will send culture and start Macrobid .  Urine hCG negative.  STD testing as ordered will contact for any positive results.  Patient declines blood work.  Encourage rest fluids and PCP follow-up if symptoms do not improve.  ER precautions reviewed. Final Clinical Impressions(s) / UC Diagnoses   Final diagnoses:  Dysuria  Acute cystitis with hematuria  Screening examination for STD (sexually transmitted disease)     Discharge Instructions      The clinic will contact you with results of the urine culture and vaginal swab done today if positive.  Start Macrobid  twice daily for 5 days to treat your urinary tract infection.  Lots of rest and fluids.  Please follow-up with your PCP if your symptoms do not improve.  Please go to the ER for any worsening symptoms.  Hope you feel better soon!    ED Prescriptions     Medication Sig Dispense Auth. Provider   nitrofurantoin , macrocrystal-monohydrate, (MACROBID ) 100 MG capsule Take 1 capsule (100 mg total) by mouth 2 (two) times daily. 10 capsule Kaniel Kiang, Jodi R, NP      PDMP not reviewed this encounter.   Loreda Myla SAUNDERS, NP 12/24/23 [phone]

## 2023-12-24 NOTE — ED Triage Notes (Addendum)
 Pt c/o strong urine odor, frequent urination, pelvic painx3d. Pt wants STI testing also

## 2023-12-24 NOTE — Discharge Instructions (Signed)
 The clinic will contact you with results of the urine culture and vaginal swab done today if positive.  Start Macrobid  twice daily for 5 days to treat your urinary tract infection.  Lots of rest and fluids.  Please follow-up with your PCP if your symptoms do not improve.  Please go to the ER for any worsening symptoms.  Hope you feel better soon!

## 2023-12-25 LAB — CERVICOVAGINAL ANCILLARY ONLY
Chlamydia: NEGATIVE
Comment: NEGATIVE
Comment: NEGATIVE
Comment: NORMAL
Neisseria Gonorrhea: NEGATIVE
Trichomonas: NEGATIVE

## 2023-12-27 LAB — URINE CULTURE: Culture: >=100000 — AB

## 2023-12-28 ENCOUNTER — Ambulatory Visit: Payer: Self-pay | Admitting: Nurse Practitioner

## 2023-12-31 ENCOUNTER — Ambulatory Visit
Admission: RE | Admit: 2023-12-31 | Discharge: 2023-12-31 | Disposition: A | Discharge status: Home / Self Care | Source: Ambulatory Visit | Attending: Nurse Practitioner

## 2023-12-31 VITALS — BP 125/80 | HR 91 | Temp 99.5°F | Resp 16

## 2023-12-31 DIAGNOSIS — Z113 Encounter for screening for infections with a predominantly sexual mode of transmission: Secondary | ICD-10-CM | POA: Diagnosis present

## 2023-12-31 DIAGNOSIS — B9789 Other viral agents as the cause of diseases classified elsewhere: Secondary | ICD-10-CM | POA: Diagnosis present

## 2023-12-31 DIAGNOSIS — J029 Acute pharyngitis, unspecified: Secondary | ICD-10-CM | POA: Diagnosis present

## 2023-12-31 DIAGNOSIS — J019 Acute sinusitis, unspecified: Secondary | ICD-10-CM | POA: Diagnosis present

## 2023-12-31 LAB — POCT URINE DIPSTICK
Bilirubin, UA: NEGATIVE
Glucose, UA: NEGATIVE mg/dL
Ketones, POC UA: NEGATIVE mg/dL
Leukocytes, UA: NEGATIVE
Nitrite, UA: NEGATIVE
POC PROTEIN,UA: NEGATIVE
Spec Grav, UA: 1.01 (ref 1.010–1.025)
Urobilinogen, UA: 0.2 U/dL
pH, UA: 6 (ref 5.0–8.0)

## 2023-12-31 LAB — POC COVID19/FLU A&B COMBO
Covid Antigen, POC: NEGATIVE
Influenza A Antigen, POC: NEGATIVE
Influenza B Antigen, POC: NEGATIVE

## 2023-12-31 LAB — POCT RAPID STREP A (OFFICE): Rapid Strep A Screen: NEGATIVE

## 2023-12-31 LAB — POCT URINE PREGNANCY: Preg Test, Ur: NEGATIVE

## 2023-12-31 MED ORDER — PROMETHAZINE-DM 6.25-15 MG/5ML PO SYRP: 10.0000 mL | ORAL_SOLUTION | Freq: Four times a day (QID) | ORAL | 0 refills | Status: DC | PRN

## 2023-12-31 MED ORDER — CETIRIZINE-PSEUDOEPHEDRINE ER 5-120 MG PO TB12: 1.0000 | ORAL_TABLET | Freq: Every day | ORAL | 0 refills | Status: AC

## 2023-12-31 MED ORDER — FLUTICASONE PROPIONATE 50 MCG/ACT NA SUSP: 2.0000 | Freq: Every day | NASAL | 0 refills | Status: DC

## 2023-12-31 NOTE — Discharge Instructions (Addendum)
 You were seen today for congestion, runny nose, sinus pressure, sore throat, and headache. Your tests for strep, flu, and COVID were negative, and your symptoms are most consistent with a viral sinus infection. Viral infections do not require antibiotics, but they can still cause several days of discomfort. To help your symptoms improve, drink plenty of fluids, get good rest, use saline nasal sprays or rinses, try a humidifier at home, and place warm compresses over your sinuses to ease pressure. Over-the-counter medicines such as Tylenol  or ibuprofen  for pain, Mucinex for congestion, and throat lozenges or warm saltwater gargles for sore throat may help you feel better. We will notify you only if your throat culture comes back positive. Follow up with your primary care provider if your symptoms get worse, last longer than 7-10 days, or if new symptoms develop. Go to the emergency department if you have trouble breathing, severe facial swelling, a high fever that does not improve, or if you begin feeling very ill.  You were also tested today for sexually transmitted infections, including gonorrhea, chlamydia, and trichomonas. HIV and syphilis testing were declined. Results are pending. Until all results are back and any needed treatment is completed, you should avoid sexual activity. If you develop any new symptoms, avoid intercourse until these completely resolve. Practicing safe sex, including consistent condom use and routine testing with new partners, is strongly encouraged. You will be contacted only if any of your tests are positive or require treatment; otherwise, your results will be available in MyChart.

## 2023-12-31 NOTE — ED Triage Notes (Signed)
 Pt states cough, sore throat and headache for the past 2 days.  States she has been taking Nyquil at home. Pt also request a swab for STD's.  Denies any symptoms.

## 2023-12-31 NOTE — ED Provider Notes (Signed)
 [facility]    CSN: [id number] Arrival date & time: 12/31/23  1256      History   Chief Complaint Chief Complaint  Patient presents with   Cough    Entered by patient    HPI Kimberly Roach is a 23 y.o. female.   Discussed the use of AI scribe software for clinical note transcription with the patient, who gave verbal consent to proceed.   The patient presents with a 2-day history of upper respiratory symptoms that she believes are sinus-related, along with a request for STD screening. Her symptoms began 2 days ago and worsened yesterday, initially starting with throat pain and a dry cough that becomes severe at night, causing her to wake up coughing. She describes really bad headaches as her primary concern, along with throat pain that was particularly bothersome this morning. The patient reports nasal symptoms that are runny during the day but become more congested at night. She experiences mucus drainage down the back of her throat, especially when lying down. She has body aches but denies fever, chills, nausea, vomiting, or diarrhea.   The patient has been managing her symptoms with DayQuil during the day and NightQuil at night, which allowed her to continue working. 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   Regarding STD screening, the patient reports no vaginal discharge, itching, odor, or dysuria. Her last menstrual period was November 13th, and she is currently on birth control. She has had 2 female partners in the past 3 months and uses condoms sometimes. She is interested only in vaginal swab testing and declines blood testing for HIV or syphilis.  The following sections of the patient's history were reviewed and updated as appropriate: allergies, current medications, past family history, past medical history, past social history, past surgical history, and problem list.       Past Medical History:  Diagnosis Date   Anxiety     Depression    Sickle cell anemia (HCC)    Wears contact lenses     Patient Active Problem List   Diagnosis Date Noted   Gross hematuria 07/25/2020   Complex tear of lateral meniscus of right knee as current injury    Right knee pain 09/14/2019   Marijuana abuse 12/31/2017   Other specified anxiety disorders 12/24/2017   MDD (major depressive disorder), recurrent episode, moderate (HCC) 12/08/2017   Other constipation 03/28/2016   Vaginal discharge 03/28/2016   Bacterial vaginosis 07/23/2014   Recurrent UTI 07/23/2014    Past Surgical History:  Procedure Laterality Date   KNEE ARTHROSCOPY Right 04/02/2020   Procedure: RIGHT KNEE ARTHROSCOPY, DEBRIDEMENT;  Surgeon: Addie Cordella Hamilton, MD;  Location: [facility];  Service: Orthopedics;  Laterality: Right;   KNEE ARTHROSCOPY WITH LATERAL MENISECTOMY Right 04/02/2020   Procedure: KNEE ARTHROSCOPY WITH LATERAL Partial MENISECTOMY;  Surgeon: Addie Cordella Hamilton, MD;  Location: [facility];  Service: Orthopedics;  Laterality: Right;   TONSILLECTOMY     TONSILLECTOMY AND ADENOIDECTOMY N/A 11/22/2014   Procedure: TONSILLECTOMY AND ADENOIDECTOMY;  Surgeon: Carolee Hunter, MD;  Location: [facility];  Service: ENT;  Laterality: N/A;  ADENOIDS CAUTERIZED NO TISSUE SENT FOR PATHOLOGY    OB History     Gravida  1   Para      Term      Preterm      AB  1   Living         SAB  1  IAB      Ectopic      Multiple      Live Births               Home Medications    Prior to Admission medications  Medication Sig Start Date End Date Taking? Authorizing Provider  cetirizine -pseudoephedrine (ZYRTEC -D) 5-120 MG tablet Take 1 tablet by mouth daily with breakfast for 10 days. 12/31/23 01/10/24 Yes Jathan Balling, FNP  fluticasone (FLONASE) 50 MCG/ACT nasal spray Place 2 sprays into both nostrils daily. Shake well before use. Gently blow nose before spraying. Do not blow nose immediately  after use. You should not taste the medication or feel it going down your throat; if you do, adjust your technique. 12/31/23  Yes Iola Lukes, FNP  promethazine -dextromethorphan (PROMETHAZINE -DM) 6.25-15 MG/5ML syrup Take 10 mLs by mouth every 6 (six) hours as needed for cough. 12/31/23  Yes Iola Lukes, FNP  albuterol  (VENTOLIN  HFA) 108 (90 Base) MCG/ACT inhaler Inhale 1-2 puffs into the lungs every 6 (six) hours as needed for wheezing or shortness of breath. 11/17/23   Mecum, Erin E, PA-C  baclofen  (LIORESAL ) 10 MG tablet Take 1 tablet (10 mg total) by mouth 3 (three) times daily. 10/30/23   Enedelia Dorna HERO, FNP  erythromycin  ophthalmic ointment Place a 1/2 inch ribbon of ointment into the lower eyelid. Patient not taking: Reported on 10/30/2023 08/20/23   Melonie Locus, PA-C  medroxyPROGESTERone  (DEPO-PROVERA ) 150 MG/ML injection Inject 1 mL (150 mg total) into the muscle every 3 (three) months. Patient not taking: Reported on 10/30/2023 06/04/23   Eveline Lynwood MATSU, MD  nitrofurantoin , macrocrystal-monohydrate, (MACROBID ) 100 MG capsule Take 1 capsule (100 mg total) by mouth 2 (two) times daily. 12/24/23   Mayer, Jodi R, NP  ipratropium (ATROVENT ) 0.06 % nasal spray Place 2 sprays into both nostrils 3 (three) times daily as needed for rhinitis. 11/06/18 11/25/18  Tellis Laneta NOVAK, NP    Family History Family History  Problem Relation Age of Onset   Healthy Mother    Anxiety disorder Father    Depression Father    Healthy Father     Social History Social History[1]   Allergies   Patient has no known allergies.   Review of Systems Review of Systems  Constitutional:  Negative for chills and fever.  HENT:  Positive for congestion (mainly at night), postnasal drip, rhinorrhea (mostly in the day), sinus pressure and sore throat.   Respiratory:  Positive for cough. Negative for shortness of breath and wheezing.   Gastrointestinal:  Negative for diarrhea, nausea and  vomiting.  Genitourinary:  Negative for dysuria, menstrual problem (LMP: 12/03/23) and vaginal discharge.       No vaginal itching, irritation or odor   Musculoskeletal:  Positive for myalgias.  Neurological:  Positive for headaches.  All other systems reviewed and are negative.    Physical Exam Triage Vital Signs ED Triage Vitals  Encounter Vitals Group     BP 12/31/23 1334 125/80     Girls Systolic BP Percentile --      Girls Diastolic BP Percentile --      Boys Systolic BP Percentile --      Boys Diastolic BP Percentile --      Pulse Rate 12/31/23 1334 91     Resp 12/31/23 1334 16     Temp 12/31/23 1334 99.5 F (37.5 C)     Temp Source 12/31/23 1334 Oral     SpO2 12/31/23 1334 98 %  Weight --      Height --      Head Circumference --      Peak Flow --      Pain Score 12/31/23 1333 6     Pain Loc --      Pain Education --      Exclude from Growth Chart --    No data found.  Updated Vital Signs BP 125/80 (BP Location: Right Arm)   Pulse 91   Temp 99.5 F (37.5 C) (Oral)   Resp 16   LMP 12/03/2023 (Exact Date)   SpO2 98%   Visual Acuity Right Eye Distance:   Left Eye Distance:   Bilateral Distance:    Right Eye Near:   Left Eye Near:    Bilateral Near:     Physical Exam Vitals reviewed.  Constitutional:      General: She is awake. She is not in acute distress.    Appearance: Normal appearance. She is well-developed. She is not ill-appearing, toxic-appearing or diaphoretic.  HENT:     Head: Normocephalic.     Right Ear: Hearing, tympanic membrane, ear canal and external ear normal. No drainage, swelling or tenderness. No middle ear effusion. Tympanic membrane is not erythematous.     Left Ear: Hearing, tympanic membrane, ear canal and external ear normal. No drainage, swelling or tenderness.  No middle ear effusion. Tympanic membrane is not erythematous.     Nose: Congestion present.     Right Sinus: Maxillary sinus tenderness present.     Left Sinus:  Maxillary sinus tenderness present.     Mouth/Throat:     Lips: Pink.     Mouth: Mucous membranes are moist.     Pharynx: Oropharynx is clear. Uvula midline. No pharyngeal swelling, oropharyngeal exudate, posterior oropharyngeal erythema or uvula swelling.     Tonsils: No tonsillar exudate or tonsillar abscesses.  Eyes:     General: Vision grossly intact.     Conjunctiva/sclera: Conjunctivae normal.  Cardiovascular:     Rate and Rhythm: Normal rate.     Heart sounds: Normal heart sounds.  Pulmonary:     Effort: Pulmonary effort is normal. No tachypnea or respiratory distress.     Breath sounds: Normal breath sounds and air entry.     Comments: Respirations even and unlabored  Abdominal:     Palpations: Abdomen is soft.  Genitourinary:    Comments: Deferred; patient performed self-swab for Aptima testing  Musculoskeletal:        General: Normal range of motion.     Cervical back: Normal range of motion and neck supple.  Lymphadenopathy:     Cervical: Cervical adenopathy present.  Skin:    General: Skin is warm and dry.  Neurological:     General: No focal deficit present.     Mental Status: She is alert and oriented to person, place, and time.  Psychiatric:        Mood and Affect: Mood normal.        Behavior: Behavior normal. Behavior is cooperative.      UC Treatments / Results  Labs (all labs ordered are listed, but only abnormal results are displayed) Labs Reviewed  POCT URINE DIPSTICK - Abnormal; Notable for the following components:      Result Value   Blood, UA trace-intact (*)    All other components within normal limits  CULTURE, GROUP A STREP [facility])  POCT RAPID STREP A (OFFICE)  POCT URINE PREGNANCY  POC COVID19/FLU A&B COMBO  CERVICOVAGINAL ANCILLARY ONLY    EKG   Radiology No results found.  Procedures Procedures (including critical care time)  Medications Ordered in UC Medications - No data to display  Initial Impression / Assessment and  Plan / UC Course  I have reviewed the triage vital signs and the nursing notes.  Pertinent labs & imaging results that were available during my care of the patient were reviewed by me and considered in my medical decision making (see chart for details).     The patient presents with symptoms of congestion, runny nose, sinus pressure, sore throat, and headache --- most consistent with viral sinusitis. A rapid strep test was negative, and a throat culture was sent for confirmation. FLU & COVID also negative. Because the presentation suggests a viral rather than bacterial infection, supportive care is recommended, including hydration, rest, saline nasal rinses, humidifier use, warm compresses over the sinuses, and over-the-counter medications for symptom relief as appropriate. Antibiotics are not indicated at this time. The patient will be notified if the throat culture returns positive. Follow-up with a primary care provider is advised if symptoms worsen, persist beyond 7-10 days, or new concerns develop. Seek emergency care for difficulty breathing, severe facial swelling, high fever, or signs of systemic illness.  Patient also presents for STD testing. She denies any symptoms at this time. Tests obtained today include gonorrhea, chlamydia and trichomonas. HIV and syphilis was dclined.. Results are pending. Patient was counseled to abstain from sexual activity until all results have been received, any necessary treatment has been completed, and any symptoms, if present, have resolved. Safe sex practices were discussed and encouraged, including consistent condom use and regular screening with new partners. Patient advised they will only be contacted if any results are positive or require follow-up; otherwise, they may review their results through MyChart.  Today's evaluation has revealed no signs of a dangerous process. Discussed diagnosis with patient and/or guardian. Patient and/or guardian aware of their  diagnosis, possible red flag symptoms to watch out for and need for close follow up. Patient and/or guardian understands verbal and written discharge instructions. Patient and/or guardian comfortable with plan and disposition.  Patient and/or guardian has a clear mental status at this time, good insight into illness (after discussion and teaching) and has clear judgment to make decisions regarding their care  Documentation was completed with the aid of voice recognition software. Transcription may contain typographical errors.    Final Clinical Impressions(s) / UC Diagnoses   Final diagnoses:  Acute sore throat  Acute viral sinusitis  Screen for STD (sexually transmitted disease)     Discharge Instructions      You were seen today for congestion, runny nose, sinus pressure, sore throat, and headache. Your tests for strep, flu, and COVID were negative, and your symptoms are most consistent with a viral sinus infection. Viral infections do not require antibiotics, but they can still cause several days of discomfort. To help your symptoms improve, drink plenty of fluids, get good rest, use saline nasal sprays or rinses, try a humidifier at home, and place warm compresses over your sinuses to ease pressure. Over-the-counter medicines such as Tylenol  or ibuprofen  for pain, Mucinex for congestion, and throat lozenges or warm saltwater gargles for sore throat may help you feel better. We will notify you only if your throat culture comes back positive. Follow up with your primary care provider if your symptoms get worse, last longer than 7-10 days, or if new symptoms develop. Go to the  emergency department if you have trouble breathing, severe facial swelling, a high fever that does not improve, or if you begin feeling very ill.  You were also tested today for sexually transmitted infections, including gonorrhea, chlamydia, and trichomonas. HIV and syphilis testing were declined. Results are pending.  Until all results are back and any needed treatment is completed, you should avoid sexual activity. If you develop any new symptoms, avoid intercourse until these completely resolve. Practicing safe sex, including consistent condom use and routine testing with new partners, is strongly encouraged. You will be contacted only if any of your tests are positive or require treatment; otherwise, your results will be available in MyChart.     ED Prescriptions     Medication Sig Dispense Auth. Provider   cetirizine -pseudoephedrine (ZYRTEC -D) 5-120 MG tablet Take 1 tablet by mouth daily with breakfast for 10 days. 10 tablet Almas Rake, FNP   fluticasone (FLONASE) 50 MCG/ACT nasal spray Place 2 sprays into both nostrils daily. Shake well before use. Gently blow nose before spraying. Do not blow nose immediately after use. You should not taste the medication or feel it going down your throat; if you do, adjust your technique. 16 g Iola Lukes, FNP   promethazine -dextromethorphan (PROMETHAZINE -DM) 6.25-15 MG/5ML syrup Take 10 mLs by mouth every 6 (six) hours as needed for cough. 118 mL Iola Lukes, FNP      PDMP not reviewed this encounter.     [1]  Social History Tobacco Use   Smoking status: Never   Smokeless tobacco: Never  Vaping Use   Vaping status: Former  Substance Use Topics   Alcohol use: Yes    Comment: Ocasionally   Drug use: Yes    Types: Marijuana     Iola Lukes, FNP 12/31/23 1458

## 2024-01-01 ENCOUNTER — Ambulatory Visit (HOSPITAL_COMMUNITY): Payer: Self-pay

## 2024-01-01 LAB — CERVICOVAGINAL ANCILLARY ONLY
Bacterial Vaginitis (gardnerella): NEGATIVE
Candida Glabrata: NEGATIVE
Candida Vaginitis: NEGATIVE
Chlamydia: POSITIVE — AB
Comment: NEGATIVE
Comment: NEGATIVE
Comment: NEGATIVE
Comment: NEGATIVE
Comment: NEGATIVE
Comment: NORMAL
Neisseria Gonorrhea: NEGATIVE
Trichomonas: NEGATIVE

## 2024-01-01 MED ORDER — DOXYCYCLINE HYCLATE 100 MG PO TABS: 100.0000 mg | ORAL_TABLET | Freq: Two times a day (BID) | ORAL | 0 refills | Status: AC

## 2024-01-03 LAB — CULTURE, GROUP A STREP (THRC)

## 2024-01-04 ENCOUNTER — Telehealth: Admitting: Physician Assistant

## 2024-01-04 DIAGNOSIS — B9689 Other specified bacterial agents as the cause of diseases classified elsewhere: Secondary | ICD-10-CM | POA: Diagnosis not present

## 2024-01-04 DIAGNOSIS — J208 Acute bronchitis due to other specified organisms: Secondary | ICD-10-CM | POA: Diagnosis not present

## 2024-01-04 MED ORDER — AZITHROMYCIN 250 MG PO TABS: ORAL_TABLET | ORAL | 0 refills | Status: AC

## 2024-01-04 MED ORDER — PSEUDOEPH-BROMPHEN-DM 30-2-10 MG/5ML PO SYRP: 5.0000 mL | ORAL_SOLUTION | Freq: Four times a day (QID) | ORAL | 0 refills | Status: DC | PRN

## 2024-01-04 MED ORDER — PREDNISONE 20 MG PO TABS: 40.0000 mg | ORAL_TABLET | Freq: Every day | ORAL | 0 refills | Status: DC

## 2024-01-04 NOTE — Patient Instructions (Signed)
 Kimberly Roach, thank you for joining Delon CHRISTELLA Dickinson, PA-C for today's virtual visit.  While this provider is not your primary care provider (PCP), if your PCP is located in our provider database this encounter information will be shared with them immediately following your visit.   A [facility] MyChart account gives you access to today's visit and all your visits, tests, and labs performed at [facility]  click here if you don't have a [facility] MyChart account or go to [email or web address]  Consent: (Patient) Kimberly Roach provided verbal consent for this virtual visit at the beginning of the encounter.  Current Medications:  Current Outpatient Medications:    azithromycin  (ZITHROMAX ) 250 MG tablet, Take 2 tablets on day 1, then 1 tablet daily on days 2 through 5, Disp: 6 tablet, Rfl: 0   brompheniramine-pseudoephedrine -DM 30-2-10 MG/5ML syrup, Take 5 mLs by mouth 4 (four) times daily as needed., Disp: 120 mL, Rfl: 0   predniSONE  (DELTASONE ) 20 MG tablet, Take 2 tablets (40 mg total) by mouth daily with breakfast., Disp: 10 tablet, Rfl: 0   albuterol  (VENTOLIN  HFA) 108 (90 Base) MCG/ACT inhaler, Inhale 1-2 puffs into the lungs every 6 (six) hours as needed for wheezing or shortness of breath., Disp: 8 g, Rfl: 0   baclofen  (LIORESAL ) 10 MG tablet, Take 1 tablet (10 mg total) by mouth 3 (three) times daily., Disp: 30 each, Rfl: 0   cetirizine -pseudoephedrine  (ZYRTEC -D) 5-120 MG tablet, Take 1 tablet by mouth daily with breakfast for 10 days., Disp: 10 tablet, Rfl: 0   doxycycline  (VIBRA -TABS) 100 MG tablet, Take 1 tablet (100 mg total) by mouth 2 (two) times daily for 7 days., Disp: 14 tablet, Rfl: 0   erythromycin  ophthalmic ointment, Place a 1/2 inch ribbon of ointment into the lower eyelid. (Patient not taking: Reported on 10/30/2023), Disp: 3.5 g, Rfl: 0   fluticasone  (FLONASE ) 50 MCG/ACT nasal spray, Place 2 sprays into both nostrils daily. Shake well before use.  Gently blow nose before spraying. Do not blow nose immediately after use. You should not taste the medication or feel it going down your throat; if you do, adjust your technique., Disp: 16 g, Rfl: 0   medroxyPROGESTERone  (DEPO-PROVERA ) 150 MG/ML injection, Inject 1 mL (150 mg total) into the muscle every 3 (three) months. (Patient not taking: Reported on 10/30/2023), Disp: 1 mL, Rfl: 4   nitrofurantoin , macrocrystal-monohydrate, (MACROBID ) 100 MG capsule, Take 1 capsule (100 mg total) by mouth 2 (two) times daily., Disp: 10 capsule, Rfl: 0   promethazine -dextromethorphan (PROMETHAZINE -DM) 6.25-15 MG/5ML syrup, Take 10 mLs by mouth every 6 (six) hours as needed for cough., Disp: 118 mL, Rfl: 0   Medications ordered in this encounter:  Meds ordered this encounter  Medications   brompheniramine-pseudoephedrine -DM 30-2-10 MG/5ML syrup    Sig: Take 5 mLs by mouth 4 (four) times daily as needed.    Dispense:  120 mL    Refill:  0    Supervising Provider:   BLAISE ALEENE KIDD [[id number]]   predniSONE  (DELTASONE ) 20 MG tablet    Sig: Take 2 tablets (40 mg total) by mouth daily with breakfast.    Dispense:  10 tablet    Refill:  0    Supervising Provider:   LAMPTEY, PHILIP O [[id number]]   azithromycin  (ZITHROMAX ) 250 MG tablet    Sig: Take 2 tablets on day 1, then 1 tablet daily on days 2 through 5    Dispense:  6 tablet    Refill:  0  Supervising Provider:   BLAISE ALEENE KIDD [[id number]]     *If you need refills on other medications prior to your next appointment, please contact your pharmacy*  Follow-Up: Call back or seek an in-person evaluation if the symptoms worsen or if the condition fails to improve as anticipated.  [facility] [phone]  Other Instructions Acute Bronchitis, Adult  Acute bronchitis is sudden inflammation of the main airways (bronchi) that come off the windpipe (trachea) in the lungs. The swelling causes the airways to get smaller and make more mucus than  normal. This can make it hard to breathe and can cause coughing or noisy breathing (wheezing). Acute bronchitis may last several weeks. The cough may last longer. Allergies, asthma, and exposure to smoke may make the condition worse. What are the causes? This condition can be caused by germs and by substances that irritate the lungs, including: Cold and flu viruses. The most common cause of this condition is the virus that causes the common cold. Bacteria. This is less common. Breathing in substances that irritate the lungs, including: Smoke from cigarettes and other forms of tobacco. Dust and pollen. Fumes from household cleaning products, gases, or burned fuel. Indoor or outdoor air pollution. What increases the risk? The following factors may make you more likely to develop this condition: A weak body's defense system, also called the immune system. A condition that affects your lungs and breathing, such as asthma. What are the signs or symptoms? Common symptoms of this condition include: Coughing. This may bring up clear, yellow, or green mucus from your lungs (sputum). Wheezing. Runny or stuffy nose. Having too much mucus in your lungs (chest congestion). Shortness of breath. Aches and pains, including sore throat or chest. How is this diagnosed? This condition is usually diagnosed based on: Your symptoms and medical history. A physical exam. You may also have other tests, including tests to rule out other conditions, such as pneumonia. These tests include: A test of lung function. Test of a mucus sample to look for the presence of bacteria. Tests to check the oxygen level in your blood. Blood tests. Chest X-ray. How is this treated? Most cases of acute bronchitis clear up over time without treatment. Your health care provider may recommend: Drinking more fluids to help thin your mucus so it is easier to cough up. Taking inhaled medicine (inhaler) to improve air flow in and  out of your lungs. Using a vaporizer or a humidifier. These are machines that add water to the air to help you breathe better. Taking a medicine that thins mucus and clears congestion (expectorant). Taking a medicine that prevents or stops coughing (cough suppressant). It is not common to take an antibiotic medicine for this condition. Follow these instructions at home:  Take over-the-counter and prescription medicines only as told by your health care provider. Use an inhaler, vaporizer, or humidifier as told by your health care provider. Take two teaspoons (10 mL) of honey at bedtime to lessen coughing at night. Drink enough fluid to keep your urine pale yellow. Do not use any products that contain nicotine or tobacco. These products include cigarettes, chewing tobacco, and vaping devices, such as e-cigarettes. If you need help quitting, ask your health care provider. Get plenty of rest. Return to your normal activities as told by your health care provider. Ask your health care provider what activities are safe for you. Keep all follow-up visits. This is important. How is this prevented? To lower your risk of  getting this condition again: Wash your hands often with soap and water for at least 20 seconds. If soap and water are not available, use hand sanitizer. Avoid contact with people who have cold symptoms. Try not to touch your mouth, nose, or eyes with your hands. Avoid breathing in smoke or chemical fumes. Breathing smoke or chemical fumes will make your condition worse. Get the flu shot every year. Contact a health care provider if: Your symptoms do not improve after 2 weeks. You have trouble coughing up the mucus. Your cough keeps you awake at night. You have a fever. Get help right away if you: Cough up blood. Feel pain in your chest. Have severe shortness of breath. Faint or keep feeling like you are going to faint. Have a severe headache. Have a fever or chills that get  worse. These symptoms may represent a serious problem that is an emergency. Do not wait to see if the symptoms will go away. Get medical help right away. Call your local emergency services (911 in the [place] Do not drive yourself to the hospital. Summary Acute bronchitis is inflammation of the main airways (bronchi) that come off the windpipe (trachea) in the lungs. The swelling causes the airways to get smaller and make more mucus than normal. Drinking more fluids can help thin your mucus so it is easier to cough up. Take over-the-counter and prescription medicines only as told by your health care provider. Do not use any products that contain nicotine or tobacco. These products include cigarettes, chewing tobacco, and vaping devices, such as e-cigarettes. If you need help quitting, ask your health care provider. Contact a health care provider if your symptoms do not improve after 2 weeks. This information is not intended to replace advice given to you by your health care provider. Make sure you discuss any questions you have with your health care provider. Document Revised: 04/18/2021 Document Reviewed: 05/09/2020 Elsevier Patient Education  2024 [employer]   If you have been instructed to have an in-person evaluation today at a local Urgent Care facility, please use the link below. It will take you to a list of all of our available [facility], including address, phone number and hours of operation. Please do not delay care.  [facility]  If you or a family member do not have a primary care provider, use the link below to schedule a visit and establish care. When you choose a [facility] primary care physician or advanced practice provider, you gain a long-term partner in health. Find a Primary Care Provider  Learn more about [facility]'s in-office and virtual care options: [facility] - Get Care Now

## 2024-01-04 NOTE — Progress Notes (Signed)
 Virtual Visit Consent   Kimberly Roach, you are scheduled for a virtual visit with a [facility] provider today. Just as with appointments in the office, your consent must be obtained to participate. Your consent will be active for this visit and any virtual visit you may have with one of our providers in the next 365 days. If you have a MyChart account, a copy of this consent can be sent to you electronically.  As this is a virtual visit, video technology does not allow for your provider to perform a traditional examination. This may limit your provider's ability to fully assess your condition. If your provider identifies any concerns that need to be evaluated in person or the need to arrange testing (such as labs, EKG, etc.), we will make arrangements to do so. Although advances in technology are sophisticated, we cannot ensure that it will always work on either your end or our end. If the connection with a video visit is poor, the visit may have to be switched to a telephone visit. With either a video or telephone visit, we are not always able to ensure that we have a secure connection.  By engaging in this virtual visit, you consent to the provision of healthcare and authorize for your insurance to be billed (if applicable) for the services provided during this visit. Depending on your insurance coverage, you may receive a charge related to this service.  I need to obtain your verbal consent now. Are you willing to proceed with your visit today? Blessyn Sommerville has provided verbal consent on 01/04/2024 for a virtual visit (video or telephone). Delon CHRISTELLA Dickinson, PA-C  Date: 01/04/2024 8:52 AM   Virtual Visit via Video Note   I, Delon CHRISTELLA Dickinson, connected with  Antonetta Clanton  ([id number], 09-20-2000) on 01/04/2024 at  8:00 AM EST by a video-enabled telemedicine application and verified that I am speaking with the correct person using two identifiers.  Location: Patient: Virtual Visit  Location Patient: Home Provider: Virtual Visit Location Provider: Home Office   I discussed the limitations of evaluation and management by telemedicine and the availability of in person appointments. The patient expressed understanding and agreed to proceed.    History of Present Illness: Kimberly Roach is a 23 y.o. who identifies as a female who was assigned female at birth, and is being seen today for continued cough and congestion.  HPI: Cough This is a new problem. Episode onset: Seen at [facility] on 12/31/23, tested negative for Flu, Strep, and Covid; Given Albuterol  inhaler and Promethazine  DM. The problem has been gradually worsening. The problem occurs every few minutes. The cough is Non-productive. Associated symptoms include chest pain (tightness, not pain), a fever (last week, none now), headaches, a sore throat (improving), shortness of breath (with cough) and wheezing. Pertinent negatives include no chills, ear congestion, ear pain, myalgias, nasal congestion, postnasal drip or rhinorrhea. The symptoms are aggravated by lying down and cold air. She has tried a beta-agonist inhaler, prescription cough suppressant and rest for the symptoms. The treatment provided no relief. Sickle cell trait    Problems:  Patient Active Problem List   Diagnosis Date Noted   Gross hematuria 07/25/2020   Complex tear of lateral meniscus of right knee as current injury    Right knee pain 09/14/2019   Marijuana abuse 12/31/2017   Other specified anxiety disorders 12/24/2017   MDD (major depressive disorder), recurrent episode, moderate (HCC) 12/08/2017   Other constipation 03/28/2016   Vaginal discharge 03/28/2016  Bacterial vaginosis 07/23/2014   Recurrent UTI 07/23/2014    Allergies: Allergies[1] Medications: Current Medications[2]  Observations/Objective: Patient is well-developed, well-nourished in no acute distress.  Resting comfortably at home.  Head is normocephalic, atraumatic.  No labored  breathing.  Speech is clear and coherent with logical content.  Patient is alert and oriented at baseline.    Assessment and Plan: 1. Acute bacterial bronchitis (Primary) - brompheniramine-pseudoephedrine -DM 30-2-10 MG/5ML syrup; Take 5 mLs by mouth 4 (four) times daily as needed.  Dispense: 120 mL; Refill: 0 - predniSONE  (DELTASONE ) 20 MG tablet; Take 2 tablets (40 mg total) by mouth daily with breakfast.  Dispense: 10 tablet; Refill: 0 - azithromycin  (ZITHROMAX ) 250 MG tablet; Take 2 tablets on day 1, then 1 tablet daily on days 2 through 5  Dispense: 6 tablet; Refill: 0  - Worsening over a week despite OTC medications - Will treat with Z-pack and Bromfed DM - Can add Mucinex (PLAIN) during the daytime - Continue Albuterol  inhaler - Add Prednisone  - Push fluids.  - Rest.  - Steam and humidifier can help - Seek in person evaluation if worsening or symptoms fail to improve    Follow Up Instructions: I discussed the assessment and treatment plan with the patient. The patient was provided an opportunity to ask questions and all were answered. The patient agreed with the plan and demonstrated an understanding of the instructions.  A copy of instructions were sent to the patient via MyChart unless otherwise noted below.    The patient was advised to call back or seek an in-person evaluation if the symptoms worsen or if the condition fails to improve as anticipated.    Delon HERO Travius Crochet, PA-C     [1] No Known Allergies [2]  Current Outpatient Medications:    azithromycin  (ZITHROMAX ) 250 MG tablet, Take 2 tablets on day 1, then 1 tablet daily on days 2 through 5, Disp: 6 tablet, Rfl: 0   brompheniramine-pseudoephedrine -DM 30-2-10 MG/5ML syrup, Take 5 mLs by mouth 4 (four) times daily as needed., Disp: 120 mL, Rfl: 0   predniSONE  (DELTASONE ) 20 MG tablet, Take 2 tablets (40 mg total) by mouth daily with breakfast., Disp: 10 tablet, Rfl: 0   albuterol  (VENTOLIN  HFA) 108 (90 Base)  MCG/ACT inhaler, Inhale 1-2 puffs into the lungs every 6 (six) hours as needed for wheezing or shortness of breath., Disp: 8 g, Rfl: 0   baclofen  (LIORESAL ) 10 MG tablet, Take 1 tablet (10 mg total) by mouth 3 (three) times daily., Disp: 30 each, Rfl: 0   cetirizine -pseudoephedrine  (ZYRTEC -D) 5-120 MG tablet, Take 1 tablet by mouth daily with breakfast for 10 days., Disp: 10 tablet, Rfl: 0   doxycycline  (VIBRA -TABS) 100 MG tablet, Take 1 tablet (100 mg total) by mouth 2 (two) times daily for 7 days., Disp: 14 tablet, Rfl: 0   erythromycin  ophthalmic ointment, Place a 1/2 inch ribbon of ointment into the lower eyelid. (Patient not taking: Reported on 10/30/2023), Disp: 3.5 g, Rfl: 0   fluticasone  (FLONASE ) 50 MCG/ACT nasal spray, Place 2 sprays into both nostrils daily. Shake well before use. Gently blow nose before spraying. Do not blow nose immediately after use. You should not taste the medication or feel it going down your throat; if you do, adjust your technique., Disp: 16 g, Rfl: 0   medroxyPROGESTERone  (DEPO-PROVERA ) 150 MG/ML injection, Inject 1 mL (150 mg total) into the muscle every 3 (three) months. (Patient not taking: Reported on 10/30/2023), Disp: 1 mL, Rfl: 4  nitrofurantoin , macrocrystal-monohydrate, (MACROBID ) 100 MG capsule, Take 1 capsule (100 mg total) by mouth 2 (two) times daily., Disp: 10 capsule, Rfl: 0   promethazine -dextromethorphan (PROMETHAZINE -DM) 6.25-15 MG/5ML syrup, Take 10 mLs by mouth every 6 (six) hours as needed for cough., Disp: 118 mL, Rfl: 0

## 2024-01-25 ENCOUNTER — Ambulatory Visit
Admission: RE | Admit: 2024-01-25 | Discharge: 2024-01-25 | Disposition: A | Discharge status: Home / Self Care | Payer: Self-pay | Source: Ambulatory Visit | Attending: Family Medicine | Admitting: Family Medicine

## 2024-01-25 VITALS — BP 126/85 | HR 96 | Temp 99.0°F | Resp 16 | Ht 61.0 in | Wt 115.0 lb

## 2024-01-25 DIAGNOSIS — Z3200 Encounter for pregnancy test, result unknown: Secondary | ICD-10-CM | POA: Insufficient documentation

## 2024-01-25 DIAGNOSIS — R8290 Unspecified abnormal findings in urine: Secondary | ICD-10-CM | POA: Insufficient documentation

## 2024-01-25 DIAGNOSIS — R300 Dysuria: Secondary | ICD-10-CM | POA: Insufficient documentation

## 2024-01-25 DIAGNOSIS — Z113 Encounter for screening for infections with a predominantly sexual mode of transmission: Secondary | ICD-10-CM | POA: Insufficient documentation

## 2024-01-25 DIAGNOSIS — R82998 Other abnormal findings in urine: Secondary | ICD-10-CM | POA: Insufficient documentation

## 2024-01-25 LAB — POCT URINE DIPSTICK
Bilirubin, UA: NEGATIVE
Glucose, UA: NEGATIVE mg/dL
Ketones, POC UA: NEGATIVE mg/dL
Nitrite, UA: NEGATIVE
POC PROTEIN,UA: NEGATIVE
Spec Grav, UA: 1.015
Urobilinogen, UA: 0.2 U/dL
pH, UA: 6

## 2024-01-25 LAB — POCT URINE PREGNANCY: Preg Test, Ur: NEGATIVE

## 2024-01-25 MED ORDER — CEPHALEXIN 500 MG PO CAPS: 500.0000 mg | ORAL_CAPSULE | Freq: Two times a day (BID) | ORAL | 0 refills | Status: AC

## 2024-01-25 NOTE — ED Provider Notes (Signed)
 " [facility]    CSN: [id number] Arrival date & time: 01/25/24  0814      History   Chief Complaint Chief Complaint  Patient presents with   Possible Pregnancy    Entered by patient   UTI symptoms    HPI Kimberly Roach is a 24 y.o. female presents for malodorous urine.  Patient reports 2 days of a strong malodorous urine with frequency.  Denies burning with urination, urgency, hematuria, fevers, nausea/vomiting, flank pain.  No vaginal discharge but would like STD testing.  In addition reports last menstrual cycle was December 11 states she took 4 home pregnancy test in which 2 were positive and 2 were negative.  No other concerns at this time.   Possible Pregnancy    Past Medical History:  Diagnosis Date   Anxiety    Depression    Sickle cell anemia (HCC)    Wears contact lenses     Patient Active Problem List   Diagnosis Date Noted   Gross hematuria 07/25/2020   Complex tear of lateral meniscus of right knee as current injury    Right knee pain 09/14/2019   Marijuana abuse 12/31/2017   Other specified anxiety disorders 12/24/2017   MDD (major depressive disorder), recurrent episode, moderate (HCC) 12/08/2017   Other constipation 03/28/2016   Vaginal discharge 03/28/2016   Bacterial vaginosis 07/23/2014   Recurrent UTI 07/23/2014    Past Surgical History:  Procedure Laterality Date   KNEE ARTHROSCOPY Right 04/02/2020   Procedure: RIGHT KNEE ARTHROSCOPY, DEBRIDEMENT;  Surgeon: Addie Cordella Hamilton, MD;  Location: [facility];  Service: Orthopedics;  Laterality: Right;   KNEE ARTHROSCOPY WITH LATERAL MENISECTOMY Right 04/02/2020   Procedure: KNEE ARTHROSCOPY WITH LATERAL Partial MENISECTOMY;  Surgeon: Addie Cordella Hamilton, MD;  Location: [facility];  Service: Orthopedics;  Laterality: Right;   TONSILLECTOMY     TONSILLECTOMY AND ADENOIDECTOMY N/A 11/22/2014   Procedure: TONSILLECTOMY AND ADENOIDECTOMY;  Surgeon: Carolee Hunter,  MD;  Location: [facility];  Service: ENT;  Laterality: N/A;  ADENOIDS CAUTERIZED NO TISSUE SENT FOR PATHOLOGY    OB History     Gravida  1   Para      Term      Preterm      AB  1   Living         SAB  1   IAB      Ectopic      Multiple      Live Births               Home Medications    Prior to Admission medications  Medication Sig Start Date End Date Taking? Authorizing Provider  cephALEXin  (KEFLEX ) 500 MG capsule Take 1 capsule (500 mg total) by mouth 2 (two) times daily for 7 days. 01/25/24 02/01/24 Yes Shatisha Falter, Jodi R, NP  albuterol  (VENTOLIN  HFA) 108 (90 Base) MCG/ACT inhaler Inhale 1-2 puffs into the lungs every 6 (six) hours as needed for wheezing or shortness of breath. 11/17/23   Mecum, Erin E, PA-C  baclofen  (LIORESAL ) 10 MG tablet Take 1 tablet (10 mg total) by mouth 3 (three) times daily. 10/30/23   Enedelia Dorna HERO, FNP  brompheniramine-pseudoephedrine -DM 30-2-10 MG/5ML syrup Take 5 mLs by mouth 4 (four) times daily as needed. 01/04/24   Vivienne Delon HERO, PA-C  erythromycin  ophthalmic ointment Place a 1/2 inch ribbon of ointment into the lower eyelid. Patient not taking: Reported on 10/30/2023 08/20/23   Melonie Locus,  PA-C  fluticasone  (FLONASE ) 50 MCG/ACT nasal spray Place 2 sprays into both nostrils daily. Shake well before use. Gently blow nose before spraying. Do not blow nose immediately after use. You should not taste the medication or feel it going down your throat; if you do, adjust your technique. 12/31/23   Iola Lukes, FNP  medroxyPROGESTERone  (DEPO-PROVERA ) 150 MG/ML injection Inject 1 mL (150 mg total) into the muscle every 3 (three) months. Patient not taking: Reported on 10/30/2023 06/04/23   Eveline Lynwood MATSU, MD  nitrofurantoin , macrocrystal-monohydrate, (MACROBID ) 100 MG capsule Take 1 capsule (100 mg total) by mouth 2 (two) times daily. 12/24/23   Emran Molzahn, Jodi R, NP  predniSONE  (DELTASONE ) 20 MG tablet Take 2 tablets  (40 mg total) by mouth daily with breakfast. 01/04/24   Burnette, Jennifer M, PA-C  promethazine -dextromethorphan (PROMETHAZINE -DM) 6.25-15 MG/5ML syrup Take 10 mLs by mouth every 6 (six) hours as needed for cough. 12/31/23   Murrill, Samantha, FNP  ipratropium (ATROVENT ) 0.06 % nasal spray Place 2 sprays into both nostrils 3 (three) times daily as needed for rhinitis. 11/06/18 11/25/18  Tellis Laneta NOVAK, NP    Family History Family History  Problem Relation Age of Onset   Healthy Mother    Anxiety disorder Father    Depression Father    Healthy Father     Social History Social History[1]   Allergies   Patient has no known allergies.   Review of Systems Review of Systems  Genitourinary:        Malodorous urine     Physical Exam Triage Vital Signs ED Triage Vitals  Encounter Vitals Group     BP 01/25/24 0830 126/85     Girls Systolic BP Percentile --      Girls Diastolic BP Percentile --      Boys Systolic BP Percentile --      Boys Diastolic BP Percentile --      Pulse Rate 01/25/24 0830 96     Resp 01/25/24 0830 16     Temp 01/25/24 0830 99 F (37.2 C)     Temp Source 01/25/24 0830 Oral     SpO2 01/25/24 0830 97 %     Weight 01/25/24 0831 115 lb (52.2 kg)     Height 01/25/24 0831 5' 1 (1.549 m)     Head Circumference --      Peak Flow --      Pain Score 01/25/24 0831 0     Pain Loc --      Pain Education --      Exclude from Growth Chart --    No data found.  Updated Vital Signs BP 126/85 (BP Location: Left Arm)   Pulse 96   Temp 99 F (37.2 C) (Oral)   Resp 16   Ht 5' 1 (1.549 m)   Wt 115 lb (52.2 kg)   LMP 12/31/2023 (Exact Date)   SpO2 97%   BMI 21.73 kg/m   Visual Acuity Right Eye Distance:   Left Eye Distance:   Bilateral Distance:    Right Eye Near:   Left Eye Near:    Bilateral Near:     Physical Exam Vitals and nursing note reviewed.  Constitutional:      Appearance: Normal appearance.  HENT:     Head: Normocephalic and  atraumatic.  Eyes:     Pupils: Pupils are equal, round, and reactive to light.  Cardiovascular:     Rate and Rhythm: Normal rate.  Pulmonary:  Effort: Pulmonary effort is normal.  Skin:    General: Skin is warm and dry.  Neurological:     General: No focal deficit present.     Mental Status: She is alert and oriented to person, place, and time.  Psychiatric:        Mood and Affect: Mood normal.        Behavior: Behavior normal.      UC Treatments / Results  Labs (all labs ordered are listed, but only abnormal results are displayed) Labs Reviewed  POCT URINE DIPSTICK - Abnormal; Notable for the following components:      Result Value   Blood, UA trace-intact (*)    Leukocytes, UA Trace (*)    All other components within normal limits  URINE CULTURE  SYPHILIS: RPR W/REFLEX TO RPR TITER AND TREPONEMAL ANTIBODIES, TRADITIONAL SCREENING AND DIAGNOSIS ALGORITHM  HIV ANTIBODY (ROUTINE TESTING W REFLEX)  BETA HCG QUANT (REF LAB)  POCT URINE PREGNANCY  CERVICOVAGINAL ANCILLARY ONLY    EKG   Radiology No results found.  Procedures Procedures (including critical care time)  Medications Ordered in UC Medications - No data to display  Initial Impression / Assessment and Plan / UC Course  I have reviewed the triage vital signs and the nursing notes.  Pertinent labs & imaging results that were available during my care of the patient were reviewed by me and considered in my medical decision making (see chart for details).     UA with trace blood and trace leuks, will culture and start Keflex .  Urine hCG negative but will do beta-hCG quantitative as well.  STD testing is ordered and will contact for any positive results.  Patient to follow-up with PCP or gynecology if symptoms do not improve.  ER precautions reviewed. Final Clinical Impressions(s) / UC Diagnoses   Final diagnoses:  Possible pregnancy  Dysuria  Screening examination for STD (sexually transmitted disease)   Malodorous urine  Leukocytes in urine     Discharge Instructions      The clinic will contact you with results of the testing done today if positive.  Start Keflex  twice daily for 7 days.  Lots of rest and fluids.  Please follow-up with your PCP or gynecologist if your symptoms do not improve.  Please go to the emergency room for any worsening symptoms.  I hope you feel better soon!    ED Prescriptions     Medication Sig Dispense Auth. Provider   cephALEXin  (KEFLEX ) 500 MG capsule Take 1 capsule (500 mg total) by mouth 2 (two) times daily for 7 days. 14 capsule Khoen Genet, Jodi R, NP      PDMP not reviewed this encounter.    [1]  Social History Tobacco Use   Smoking status: Never   Smokeless tobacco: Never  Vaping Use   Vaping status: Former  Substance Use Topics   Alcohol use: Yes    Comment: Ocasionally   Drug use: Yes    Types: Marijuana     Loreda Myla SAUNDERS, NP 01/25/24 [phone]  "

## 2024-01-25 NOTE — Discharge Instructions (Signed)
 The clinic will contact you with results of the testing done today if positive.  Start Keflex  twice daily for 7 days.  Lots of rest and fluids.  Please follow-up with your PCP or gynecologist if your symptoms do not improve.  Please go to the emergency room for any worsening symptoms.  I hope you feel better soon!

## 2024-01-25 NOTE — ED Triage Notes (Signed)
 PT c/o urinary frequency with foul odor  Pt also requesting preg. Test and STD test  Pt denies any STD symptoms

## 2024-01-26 ENCOUNTER — Encounter (HOSPITAL_COMMUNITY): Payer: Self-pay | Admitting: Obstetrics & Gynecology

## 2024-01-26 ENCOUNTER — Ambulatory Visit (HOSPITAL_COMMUNITY): Payer: Self-pay

## 2024-01-26 ENCOUNTER — Inpatient Hospital Stay (HOSPITAL_COMMUNITY)
Admission: AD | Admit: 2024-01-26 | Discharge: 2024-01-26 | Disposition: A | Discharge status: Home / Self Care | Attending: Obstetrics & Gynecology | Admitting: Obstetrics & Gynecology

## 2024-01-26 DIAGNOSIS — Z3A01 Less than 8 weeks gestation of pregnancy: Secondary | ICD-10-CM

## 2024-01-26 DIAGNOSIS — Z3200 Encounter for pregnancy test, result unknown: Secondary | ICD-10-CM | POA: Insufficient documentation

## 2024-01-26 LAB — CERVICOVAGINAL ANCILLARY ONLY
Chlamydia: NEGATIVE
Comment: NEGATIVE
Comment: NEGATIVE
Comment: NORMAL
Neisseria Gonorrhea: NEGATIVE
Trichomonas: NEGATIVE

## 2024-01-26 LAB — HIV ANTIBODY (ROUTINE TESTING W REFLEX): HIV Screen 4th Generation wRfx: NONREACTIVE

## 2024-01-26 LAB — BETA HCG QUANT (REF LAB): hCG Quant: 29 m[IU]/mL

## 2024-01-26 LAB — SYPHILIS: RPR W/REFLEX TO RPR TITER AND TREPONEMAL ANTIBODIES, TRADITIONAL SCREENING AND DIAGNOSIS ALGORITHM: RPR Ser Ql: NONREACTIVE

## 2024-01-26 MED ORDER — PRENATAL VITAMIN 27-0.8 MG PO TABS: 1.0000 | ORAL_TABLET | Freq: Every day | ORAL | 11 refills | Status: AC

## 2024-01-26 NOTE — MAU Note (Signed)
 Kimberly Roach is a 24 y.o. at Unknown here in MAU reporting going to Urgent Care yesterday. Urine was negative for upt but BHCG was 29. Was told to come here for u/s to be sure everything is ok. No VB. Occ abdominal cramps but none currently   LMP: 12/31/23 Onset of complaint: na Pain score: 0 Vitals:   01/26/24 1933 01/26/24 1934  BP:  121/83  Pulse: 72   Resp: 16   Temp: 98.6 F (37 C)   SpO2: 100%      FHT: na  Lab orders placed from triage: none

## 2024-01-26 NOTE — MAU Provider Note (Signed)
 " History     [id number]  Arrival date and time: 01/26/24 1803    Chief Complaint  Patient presents with   Possible Pregnancy     HPI Kimberly Roach is a 24 y.o. at [redacted]w[redacted]d who presents at the advice of urgent care.  She states she has no complaints at this time.  She states she had a negative pregnancy test at urgent care but a beta-hCG of 29.  She was referred to MAU for further assessment.     Past Medical History:  Diagnosis Date   Anxiety    Depression    Sickle cell anemia (HCC)    Wears contact lenses     Past Surgical History:  Procedure Laterality Date   KNEE ARTHROSCOPY Right 04/02/2020   Procedure: RIGHT KNEE ARTHROSCOPY, DEBRIDEMENT;  Surgeon: Addie Cordella Hamilton, MD;  Location: [facility];  Service: Orthopedics;  Laterality: Right;   KNEE ARTHROSCOPY WITH LATERAL MENISECTOMY Right 04/02/2020   Procedure: KNEE ARTHROSCOPY WITH LATERAL Partial MENISECTOMY;  Surgeon: Addie Cordella Hamilton, MD;  Location: [facility];  Service: Orthopedics;  Laterality: Right;   TONSILLECTOMY     TONSILLECTOMY AND ADENOIDECTOMY N/A 11/22/2014   Procedure: TONSILLECTOMY AND ADENOIDECTOMY;  Surgeon: Carolee Hunter, MD;  Location: [facility];  Service: ENT;  Laterality: N/A;  ADENOIDS CAUTERIZED NO TISSUE SENT FOR PATHOLOGY    Family History  Problem Relation Age of Onset   Healthy Mother    Anxiety disorder Father    Depression Father    Healthy Father     Social History   Socioeconomic History   Marital status: Single    Spouse name: Not on file   Number of children: 0   Years of education: Not on file   Highest education level: 12th grade  Occupational History    Comment: full time student  Tobacco Use   Smoking status: Never   Smokeless tobacco: Never  Vaping Use   Vaping status: Former  Substance and Sexual Activity   Alcohol use: Yes    Comment: Ocasionally   Drug use: Yes    Types: Marijuana   Sexual activity: Yes    Birth  control/protection: None  Other Topics Concern   Not on file  Social History Narrative   Ex boyfriend   Social Drivers of Health   Tobacco Use: Low Risk (01/25/2024)   Patient History    Smoking Tobacco Use: Never    Smokeless Tobacco Use: Never    Passive Exposure: Not on file  Financial Resource Strain: Not on file  Food Insecurity: Not on file  Transportation Needs: Not on file  Physical Activity: Not on file  Stress: Not on file  Social Connections: Not on file  Intimate Partner Violence: Not on file  Depression (PHQ2-9): Low Risk (06/04/2023)   Depression (PHQ2-9)    PHQ-2 Score: 2  Alcohol Screen: Not on file  Housing: Not on file  Utilities: Not on file  Health Literacy: Not on file    Allergies[1]  Medications Ordered Prior to Encounter[2]  Pertinent positives and negative per HPI, all others reviewed and negative  Physical Exam   BP 121/83   Pulse 72   Temp 98.6 F (37 C)   Resp 16   Ht 5' 1 (1.549 m)   Wt 55.6 kg   LMP 12/31/2023 (Exact Date)   SpO2 100%   BMI 23.17 kg/m   Patient Vitals for the past 24 hrs:  BP Temp Pulse Resp SpO2  Height Weight  01/26/24 1934 121/83 -- -- -- -- -- --  01/26/24 1933 -- 98.6 F (37 C) 72 16 100 % 5' 1 (1.549 m) 55.6 kg   Labs No results found for this or any previous visit (from the past 24 hours).  Imaging No results found.  MAU Course  Procedures Lab Orders  No laboratory test(s) ordered today   Meds ordered this encounter  Medications   Prenatal Vit-Fe Fumarate-FA (PRENATAL VITAMIN) 27-0.8 MG TABS    Sig: Take 1 tablet by mouth daily.    Dispense:  30 tablet    Refill:  11   Imaging Orders  No imaging studies ordered today    MDM Low (Level 2)  Assessment and Plan    Kimberly Roach is a 24 y.o. at [redacted]w[redacted]d who presents at the advice of urgent care.   -Denies vaginal bleeding, abdominal cramping, vaginal irritation/itching, dysuria, frequency, urgency. - Patient was seen at urgent care  yesterday with UTI symptoms.  Beta-hCG was drawn at that time and found to be 29.  There is no real utility in redrawing hCG at this time in a patient without complaints.  - Stable for discharge home - Discussed establishing care with OB provider.  List provided. - Prescription for prenatal vitamins sent to patient pharmacy. - All questions answered, anticipatory guidance, detailed ectopic/bleeding/miscarriage precautions provided.    Kimberly Roach L Melesio Madara, MD/MHA 01/26/2024 8:02 PM  Allergies as of 01/26/2024   No Known Allergies      Medication List     STOP taking these medications    baclofen  10 MG tablet Commonly known as: LIORESAL    brompheniramine-pseudoephedrine -DM 30-2-10 MG/5ML syrup   erythromycin  ophthalmic ointment   fluticasone  50 MCG/ACT nasal spray Commonly known as: FLONASE    medroxyPROGESTERone  150 MG/ML injection Commonly known as: DEPO-PROVERA    nitrofurantoin  (macrocrystal-monohydrate) 100 MG capsule Commonly known as: MACROBID    predniSONE  20 MG tablet Commonly known as: DELTASONE    promethazine -dextromethorphan 6.25-15 MG/5ML syrup Commonly known as: PROMETHAZINE -DM       TAKE these medications    albuterol  108 (90 Base) MCG/ACT inhaler Commonly known as: VENTOLIN  HFA Inhale 1-2 puffs into the lungs every 6 (six) hours as needed for wheezing or shortness of breath.   cephALEXin  500 MG capsule Commonly known as: KEFLEX  Take 1 capsule (500 mg total) by mouth 2 (two) times daily for 7 days.   Prenatal Vitamin 27-0.8 MG Tabs Take 1 tablet by mouth daily.           [1] No Known Allergies [2]  No current facility-administered medications on file prior to encounter.   Current Outpatient Medications on File Prior to Encounter  Medication Sig Dispense Refill   cephALEXin  (KEFLEX ) 500 MG capsule Take 1 capsule (500 mg total) by mouth 2 (two) times daily for 7 days. 14 capsule 0   albuterol  (VENTOLIN  HFA) 108 (90 Base) MCG/ACT inhaler  Inhale 1-2 puffs into the lungs every 6 (six) hours as needed for wheezing or shortness of breath. 8 g 0   baclofen  (LIORESAL ) 10 MG tablet Take 1 tablet (10 mg total) by mouth 3 (three) times daily. (Patient not taking: Reported on 01/26/2024) 30 each 0   brompheniramine-pseudoephedrine -DM 30-2-10 MG/5ML syrup Take 5 mLs by mouth 4 (four) times daily as needed. 120 mL 0   erythromycin  ophthalmic ointment Place a 1/2 inch ribbon of ointment into the lower eyelid. (Patient not taking: Reported on 10/30/2023) 3.5 g 0   fluticasone  (FLONASE ) 50 MCG/ACT nasal spray  Place 2 sprays into both nostrils daily. Shake well before use. Gently blow nose before spraying. Do not blow nose immediately after use. You should not taste the medication or feel it going down your throat; if you do, adjust your technique. (Patient not taking: Reported on 01/26/2024) 16 g 0   medroxyPROGESTERone  (DEPO-PROVERA ) 150 MG/ML injection Inject 1 mL (150 mg total) into the muscle every 3 (three) months. (Patient not taking: Reported on 10/30/2023) 1 mL 4   nitrofurantoin , macrocrystal-monohydrate, (MACROBID ) 100 MG capsule Take 1 capsule (100 mg total) by mouth 2 (two) times daily. 10 capsule 0   predniSONE  (DELTASONE ) 20 MG tablet Take 2 tablets (40 mg total) by mouth daily with breakfast. (Patient not taking: Reported on 01/26/2024) 10 tablet 0   promethazine -dextromethorphan (PROMETHAZINE -DM) 6.25-15 MG/5ML syrup Take 10 mLs by mouth every 6 (six) hours as needed for cough. 118 mL 0   [DISCONTINUED] ipratropium (ATROVENT ) 0.06 % nasal spray Place 2 sprays into both nostrils 3 (three) times daily as needed for rhinitis. 15 mL 12   "

## 2024-01-26 NOTE — Discharge Instructions (Signed)

## 2024-01-27 LAB — URINE CULTURE: Culture: >=100000 — AB

## 2024-02-25 ENCOUNTER — Ambulatory Visit
Admission: EM | Admit: 2024-02-25 | Discharge: 2024-02-25 | Disposition: A | Discharge status: Home / Self Care | Payer: Self-pay | Source: Home / Self Care

## 2024-02-25 DIAGNOSIS — O23591 Infection of other part of genital tract in pregnancy, first trimester: Secondary | ICD-10-CM

## 2024-02-25 DIAGNOSIS — N898 Other specified noninflammatory disorders of vagina: Secondary | ICD-10-CM

## 2024-02-25 NOTE — ED Provider Notes (Signed)
 " [facility]  Note:  This document was prepared using Conservation officer, historic buildings and may include unintentional dictation errors.  MRN: [id number] DOB: 04-26-2000  Subjective:   Kimberly Roach is a 24 y.o. female presenting for 1 week history of vaginal discharge with a strong odor.  Patient is [redacted] weeks pregnant.  Denies fever, n/v, abdominal pain, pelvic pain, rashes, dysuria, urinary frequency, hematuria, vaginal bleeding.  Would like to include STI testing and her vaginal cytology.  Current Outpatient Medications  Medication Instructions   albuterol  (VENTOLIN  HFA) 108 (90 Base) MCG/ACT inhaler 1-2 puffs, Inhalation, Every 6 hours PRN   Prenatal Vit-Fe Fumarate-FA (PRENATAL VITAMIN) 27-0.8 MG TABS 1 tablet, Oral, Daily    Allergies[1]  Past Medical History:  Diagnosis Date   Anxiety    Depression    Sickle cell anemia (HCC)    Wears contact lenses      Past Surgical History:  Procedure Laterality Date   KNEE ARTHROSCOPY Right 04/02/2020   Procedure: RIGHT KNEE ARTHROSCOPY, DEBRIDEMENT;  Surgeon: Addie Cordella Hamilton, MD;  Location: [facility];  Service: Orthopedics;  Laterality: Right;   KNEE ARTHROSCOPY WITH LATERAL MENISECTOMY Right 04/02/2020   Procedure: KNEE ARTHROSCOPY WITH LATERAL Partial MENISECTOMY;  Surgeon: Addie Cordella Hamilton, MD;  Location: [facility];  Service: Orthopedics;  Laterality: Right;   TONSILLECTOMY     TONSILLECTOMY AND ADENOIDECTOMY N/A 11/22/2014   Procedure: TONSILLECTOMY AND ADENOIDECTOMY;  Surgeon: Carolee Hunter, MD;  Location: [facility];  Service: ENT;  Laterality: N/A;  ADENOIDS CAUTERIZED NO TISSUE SENT FOR PATHOLOGY    Family History  Problem Relation Age of Onset   Healthy Mother    Anxiety disorder Father    Depression Father    Healthy Father     Social History   Occupational History    Comment: full time student  Tobacco Use   Smoking status: Never    Smokeless tobacco: Never  Vaping Use   Vaping status: Former  Substance and Sexual Activity   Alcohol use: Yes    Comment: Ocasionally   Drug use: Yes    Types: Marijuana   Sexual activity: Yes    Birth control/protection: None     ROS   Objective:   Vitals: BP 132/82 (BP Location: Right Arm)   Pulse 80   Temp 98.9 F (37.2 C) (Oral)   Resp 16   LMP 12/31/2023 (Exact Date)   SpO2 98%   Physical Exam Constitutional:      General: She is not in acute distress.    Appearance: Normal appearance. She is well-developed. She is not ill-appearing, toxic-appearing or diaphoretic.  HENT:     Head: Normocephalic and atraumatic.     Nose: Nose normal.     Mouth/Throat:     Mouth: Mucous membranes are moist.  Eyes:     General: No scleral icterus.       Right eye: No discharge.        Left eye: No discharge.     Extraocular Movements: Extraocular movements intact.  Cardiovascular:     Rate and Rhythm: Normal rate.  Pulmonary:     Effort: Pulmonary effort is normal.  Skin:    General: Skin is warm and dry.  Neurological:     General: No focal deficit present.     Mental Status: She is alert and oriented to person, place, and time.  Psychiatric:        Mood and Affect:  Mood normal.        Behavior: Behavior normal.     Assessment and Plan :   PDMP not reviewed this encounter.  1. Vaginitis during pregnancy in first trimester   2. Vaginal discharge    Will treat as appropriate based off of lab results.    [1] No Known Allergies    Christopher Savannah, [place] 02/25/24 9142  "

## 2024-02-25 NOTE — ED Triage Notes (Signed)
 Pt reports she is having vaginal discharge smells extra x 1 week. Pt is [redacted] weeks pregnant.

## 2024-02-25 NOTE — ED Notes (Signed)
 Pt requested STD's test.

## 2024-02-25 NOTE — Discharge Instructions (Addendum)
 Your test results should be available tomorrow.  Our results team will let you know if you need any kind of treatment for any positive test results.

## 2024-02-26 LAB — CERVICOVAGINAL ANCILLARY ONLY
Bacterial Vaginitis (gardnerella): POSITIVE — AB
Candida Glabrata: NEGATIVE
Candida Vaginitis: NEGATIVE
Chlamydia: NEGATIVE
Comment: NEGATIVE
Comment: NEGATIVE
Comment: NEGATIVE
Comment: NEGATIVE
Comment: NEGATIVE
Comment: NORMAL
Neisseria Gonorrhea: NEGATIVE
Trichomonas: NEGATIVE
